# Patient Record
Sex: Female | Born: 1965 | Race: White | Hispanic: No | Smoking: Former smoker
Health system: Southern US, Community
[De-identification: ages and names within clinical notes are randomized; demographics above are authoritative.]

## PROBLEM LIST (undated history)

## (undated) DIAGNOSIS — E119 Type 2 diabetes mellitus without complications: Secondary | ICD-10-CM

## (undated) HISTORY — PX: OOPHORECTOMY: SHX86

---

## 2006-09-03 ENCOUNTER — Other Ambulatory Visit: Payer: Self-pay

## 2006-09-03 ENCOUNTER — Emergency Department: Payer: Self-pay | Admitting: Emergency Medicine

## 2007-02-20 ENCOUNTER — Ambulatory Visit: Payer: Self-pay | Admitting: Family Medicine

## 2007-08-23 ENCOUNTER — Ambulatory Visit: Payer: Self-pay | Admitting: Nurse Practitioner

## 2007-10-11 ENCOUNTER — Emergency Department: Payer: Self-pay | Admitting: Emergency Medicine

## 2010-04-16 ENCOUNTER — Ambulatory Visit: Payer: Self-pay | Admitting: Obstetrics and Gynecology

## 2010-04-20 ENCOUNTER — Ambulatory Visit: Payer: Self-pay | Admitting: Obstetrics and Gynecology

## 2010-05-04 ENCOUNTER — Ambulatory Visit: Payer: Self-pay | Admitting: Obstetrics and Gynecology

## 2010-05-07 ENCOUNTER — Ambulatory Visit: Payer: Self-pay | Admitting: Obstetrics and Gynecology

## 2010-05-09 ENCOUNTER — Ambulatory Visit: Payer: Self-pay | Admitting: Obstetrics and Gynecology

## 2010-05-11 LAB — PATHOLOGY REPORT

## 2010-05-20 ENCOUNTER — Ambulatory Visit: Payer: Self-pay | Admitting: Family Medicine

## 2011-06-30 ENCOUNTER — Ambulatory Visit: Payer: Self-pay | Admitting: Obstetrics and Gynecology

## 2013-01-02 ENCOUNTER — Emergency Department: Payer: Self-pay | Admitting: Emergency Medicine

## 2013-01-02 LAB — COMPREHENSIVE METABOLIC PANEL WITH GFR
Albumin: 3.7 g/dL
Alkaline Phosphatase: 77 U/L
Anion Gap: 6 — ABNORMAL LOW
BUN: 10 mg/dL
Bilirubin,Total: 0.4 mg/dL
Calcium, Total: 8.5 mg/dL
Chloride: 105 mmol/L
Co2: 27 mmol/L
Creatinine: 0.82 mg/dL
EGFR (African American): >60
EGFR (Non-African Amer.): >60
Glucose: 114 mg/dL — ABNORMAL HIGH
Osmolality: 276
Potassium: 3.7 mmol/L
SGOT(AST): 19 U/L
SGPT (ALT): 18 U/L
Sodium: 138 mmol/L
Total Protein: 7.5 g/dL

## 2013-01-02 LAB — CBC
HCT: 39.4 %
HGB: 13.2 g/dL
MCH: 26.4 pg
MCHC: 33.6 g/dL
MCV: 79 fL — ABNORMAL LOW
Platelet: 331 x10 3/mm 3
RBC: 5.01 X10 6/mm 3
RDW: 14.5 %
WBC: 8.7 x10 3/mm 3

## 2013-01-07 LAB — CULTURE, BLOOD (SINGLE)

## 2014-06-19 ENCOUNTER — Ambulatory Visit: Payer: Self-pay | Admitting: Internal Medicine

## 2014-06-24 ENCOUNTER — Ambulatory Visit: Payer: Self-pay | Admitting: Internal Medicine

## 2014-08-07 DIAGNOSIS — Z6841 Body Mass Index (BMI) 40.0 and over, adult: Secondary | ICD-10-CM | POA: Insufficient documentation

## 2015-04-27 ENCOUNTER — Other Ambulatory Visit: Payer: Self-pay | Admitting: Internal Medicine

## 2015-04-27 DIAGNOSIS — Z1231 Encounter for screening mammogram for malignant neoplasm of breast: Secondary | ICD-10-CM

## 2015-06-23 ENCOUNTER — Ambulatory Visit
Admission: RE | Admit: 2015-06-23 | Discharge: 2015-06-23 | Disposition: A | Discharge status: Home / Self Care | Payer: BC Managed Care – PPO | Source: Ambulatory Visit | Attending: Internal Medicine | Admitting: Internal Medicine

## 2015-06-23 DIAGNOSIS — Z1231 Encounter for screening mammogram for malignant neoplasm of breast: Secondary | ICD-10-CM | POA: Diagnosis present

## 2015-09-10 ENCOUNTER — Ambulatory Visit
Admission: RE | Admit: 2015-09-10 | Discharge: 2015-09-10 | Disposition: A | Discharge status: Home / Self Care | Payer: BC Managed Care – PPO | Source: Ambulatory Visit | Attending: Internal Medicine | Admitting: Internal Medicine

## 2015-09-10 ENCOUNTER — Other Ambulatory Visit: Payer: Self-pay | Admitting: Internal Medicine

## 2015-09-10 DIAGNOSIS — Z6841 Body Mass Index (BMI) 40.0 and over, adult: Secondary | ICD-10-CM | POA: Diagnosis present

## 2015-09-10 DIAGNOSIS — R935 Abnormal findings on diagnostic imaging of other abdominal regions, including retroperitoneum: Secondary | ICD-10-CM | POA: Diagnosis not present

## 2015-09-10 DIAGNOSIS — R1032 Left lower quadrant pain: Secondary | ICD-10-CM | POA: Insufficient documentation

## 2015-09-10 HISTORY — DX: Type 2 diabetes mellitus without complications: E11.9

## 2015-09-10 LAB — POCT I-STAT CREATININE: Creatinine, Ser: 0.7 mg/dL (ref 0.44–1.00)

## 2015-09-10 MED ORDER — IOPAMIDOL (ISOVUE-300) INJECTION 61%
100.0000 mL | Freq: Once | INTRAVENOUS | Status: AC | PRN
  Administered 2015-09-10: 100 mL via INTRAVENOUS

## 2016-07-19 ENCOUNTER — Ambulatory Visit
Admission: RE | Admit: 2016-07-19 | Discharge: 2016-07-19 | Disposition: A | Discharge status: Home / Self Care | Payer: BC Managed Care – PPO | Source: Ambulatory Visit | Attending: Internal Medicine | Admitting: Internal Medicine

## 2016-07-19 ENCOUNTER — Other Ambulatory Visit: Payer: Self-pay | Admitting: Internal Medicine

## 2016-07-19 DIAGNOSIS — R52 Pain, unspecified: Secondary | ICD-10-CM

## 2016-07-19 DIAGNOSIS — Z1231 Encounter for screening mammogram for malignant neoplasm of breast: Secondary | ICD-10-CM

## 2016-07-19 DIAGNOSIS — M25522 Pain in left elbow: Secondary | ICD-10-CM | POA: Diagnosis present

## 2016-07-26 DIAGNOSIS — L0293 Carbuncle, unspecified: Secondary | ICD-10-CM | POA: Insufficient documentation

## 2016-07-26 DIAGNOSIS — A4902 Methicillin resistant Staphylococcus aureus infection, unspecified site: Secondary | ICD-10-CM | POA: Insufficient documentation

## 2016-08-08 ENCOUNTER — Telehealth: Payer: Self-pay

## 2016-08-08 ENCOUNTER — Ambulatory Visit
Admission: RE | Admit: 2016-08-08 | Discharge: 2016-08-08 | Disposition: A | Discharge status: Home / Self Care | Payer: BC Managed Care – PPO | Source: Ambulatory Visit | Attending: Internal Medicine | Admitting: Internal Medicine

## 2016-08-08 ENCOUNTER — Ambulatory Visit (INDEPENDENT_AMBULATORY_CARE_PROVIDER_SITE_OTHER): Payer: BC Managed Care – PPO | Admitting: Gastroenterology

## 2016-08-08 ENCOUNTER — Other Ambulatory Visit: Payer: Self-pay | Admitting: Internal Medicine

## 2016-08-08 ENCOUNTER — Encounter: Payer: Self-pay | Admitting: Gastroenterology

## 2016-08-08 ENCOUNTER — Other Ambulatory Visit: Payer: Self-pay

## 2016-08-08 VITALS — BP 124/81 | HR 84 | Temp 98.2°F | Ht 65.0 in | Wt 265.0 lb

## 2016-08-08 DIAGNOSIS — E6609 Other obesity due to excess calories: Secondary | ICD-10-CM | POA: Diagnosis not present

## 2016-08-08 DIAGNOSIS — Z1231 Encounter for screening mammogram for malignant neoplasm of breast: Secondary | ICD-10-CM | POA: Insufficient documentation

## 2016-08-08 DIAGNOSIS — IMO0001 Reserved for inherently not codable concepts without codable children: Secondary | ICD-10-CM

## 2016-08-08 DIAGNOSIS — K5792 Diverticulitis of intestine, part unspecified, without perforation or abscess without bleeding: Secondary | ICD-10-CM

## 2016-08-08 DIAGNOSIS — Z6841 Body Mass Index (BMI) 40.0 and over, adult: Secondary | ICD-10-CM | POA: Diagnosis not present

## 2016-08-08 DIAGNOSIS — K5732 Diverticulitis of large intestine without perforation or abscess without bleeding: Secondary | ICD-10-CM | POA: Diagnosis not present

## 2016-08-08 DIAGNOSIS — Z87898 Personal history of other specified conditions: Secondary | ICD-10-CM

## 2016-08-08 DIAGNOSIS — R0683 Snoring: Secondary | ICD-10-CM

## 2016-08-08 NOTE — Telephone Encounter (Signed)
Gastroenterology Pre-Procedure Review  Request Date: 08/26/16 Requesting Physician: Dr. Tobi Bastos  PATIENT REVIEW QUESTIONS: The patient responded to the following health history questions as indicated:    1. Are you having any GI issues? yes (diverticulitis) 2. Do you have a personal history of Polyps? no 3. Do you have a family history of Colon Cancer or Polyps? no 4. 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 Joint replacements in the past 12 months?no 6. Major health problems in the past 3 months?no 7. Any artificial heart valves, MVP, or defibrillator?no    MEDICATIONS & ALLERGIES:    Patient reports the following regarding taking any anticoagulation/antiplatelet therapy:   Plavix, Coumadin, Eliquis, Xarelto, Lovenox, Pradaxa, Brilinta, or Effient? no Aspirin? no  Patient confirms/reports the following medications:  Current Outpatient Prescriptions  Medication Sig Dispense Refill  . diclofenac sodium (VOLTAREN) 1 % GEL     . metFORMIN (GLUCOPHAGE) 500 MG tablet Take by mouth.    . phentermine 37.5 MG capsule Take by mouth.    . predniSONE (DELTASONE) 10 MG tablet 6 tabs x 1 day, 5 tabs x 1 day, 4 tabs x 1 day, etc...     No current facility-administered medications for this visit.     Patient confirms/reports the following allergies:  No Known Allergies  No orders of the defined types were placed in this encounter.   AUTHORIZATION INFORMATION Primary Insurance: 1D#: Group #:  Secondary Insurance: 1D#: Group #:  SCHEDULE INFORMATION: Date: 08/26/16 Time: Location: [facility]

## 2016-08-08 NOTE — Progress Notes (Signed)
Gastroenterology Consultation  Referring Provider:     Lyndon Code, MD Primary Care Physician:  Carol Code, MD Primary Gastroenterologist:  Carol [name]  Reason for Consultation:     Diverticulitis.         HPI:   Carol [name] is a 51 y.o. y/o female referred for consultation & management  by Carol. Welton [name], Carol Harper, MD.    She says that she was seen by Carol [name] , she said she had diverticulitis 6 months back , did have a CT scan . CT scan in 09/2015 showed diverticulitis of sigmoid colon. She did not have a colonoscopy after.   Presently says on and off constipation , sometimes diarrhea. Denies any blood in her stool.No change in shape of stool. No weight loss.She does snore, does wake up in the night , does not feel refreshed in the morning , snores a lot . Never been tested for sleep apnea.   Mother had lung cancer-smoker Father - liver and lung cancer- smoker Brother- lymphoma    Body mass index is 44.1 kg/m.    Past Medical History:  Diagnosis Date  . Diabetes mellitus without complication [facility])     Past Surgical History:  Procedure Laterality Date  . OOPHORECTOMY     1 removed    Prior to Admission medications   Medication Sig Start Date End Date Taking? Authorizing Provider  metFORMIN (GLUCOPHAGE) 500 MG tablet Take by mouth. 03/25/16 03/25/17 Yes Historical Provider, MD  phentermine 37.5 MG capsule Take by mouth. 05/05/16  Yes Historical Provider, MD  predniSONE (DELTASONE) 10 MG tablet 6 tabs x 1 day, 5 tabs x 1 day, 4 tabs x 1 day, etc... 01/20/16  Yes Historical Provider, MD  diclofenac sodium (VOLTAREN) 1 % GEL  07/19/16   Historical Provider, MD    Family History  Problem Relation Age of Onset  . Breast cancer Maternal Aunt 23     Social History  Substance Use Topics  . Smoking status: Not on file  . Smokeless tobacco: Not on file  . Alcohol use Not on file    Allergies as of 08/08/2016  . (Not on File)    Review of Systems:    All  systems reviewed and negative except where noted in HPI.   Physical Exam:  LMP 08/05/2016  Patient's last menstrual period was 08/05/2016. Psych:  Alert and cooperative. Normal [name] and affect. General:   Alert,  Well-developed, well-nourished, pleasant and cooperative in NAD Head:  Normocephalic and atraumatic. Eyes:  Sclera clear, no icterus.   Conjunctiva pink. Ears:  Normal auditory acuity. Nose:  No deformity, discharge, or lesions. Mouth:  No deformity or lesions,oropharynx pink & moist. Neck:  Supple; no masses or thyromegaly. Lungs:  Respirations even and unlabored.  Clear throughout to auscultation.   No wheezes, crackles, or rhonchi. No acute distress. Heart:  Regular rate and rhythm; no murmurs, clicks, rubs, or gallops. Abdomen:  Normal bowel sounds.  No bruits.  Soft, non-tender and non-distended without masses, hepatosplenomegaly or hernias noted.  No guarding or rebound tenderness.    Msk:  Symmetrical without gross deformities. Good, equal movement & strength bilaterally. Pulses:  Normal pulses noted. Neurologic:  Alert and oriented x3;  grossly normal neurologically. Psych:  Alert and cooperative. Normal [name] and affect.  Imaging Studies: Dg Elbow Complete Left (3+view)  Result Date: 07/19/2016 CLINICAL DATA:  Pain and swelling for several weeks, no known injury, initial encounter EXAM: LEFT ELBOW -  COMPLETE 3+ VIEW COMPARISON:  None. FINDINGS: There is no evidence of fracture, dislocation, or joint effusion. There is no evidence of arthropathy or other focal bone abnormality. Soft tissues are unremarkable. IMPRESSION: No acute abnormality noted. Electronically Signed   By: Alcide Clever M.D.   On: 07/19/2016 14:46   Mm Screening Breast Tomo Bilateral  Result Date: 08/08/2016 CLINICAL DATA:  Screening. EXAM: 2D DIGITAL SCREENING BILATERAL MAMMOGRAM WITH CAD AND ADJUNCT TOMO COMPARISON:  Previous exam(s). ACR Breast Density Category b: There are scattered areas of  fibroglandular density. FINDINGS: There are no findings suspicious for malignancy. Images were processed with CAD. IMPRESSION: No mammographic evidence of malignancy. A result letter of this screening mammogram will be mailed directly to the patient. RECOMMENDATION: Screening mammogram in one year. ([name]:[id number]) BI-RADS CATEGORY  1: Negative. Electronically Signed   By: Frederico Hamman M.D.   On: 08/08/2016 11:31    Assessment and Plan:   Carol [name] is a 51 y.o. y/o female has been referred for diverticulitis which she had in 09/2015. Didn't have a diagnostic colonoscopy after. I will proceed with the same. Her history also suggests she may have sleep apnea and I will send her for testing . I advised her to loose weight as she is morbidly obese. She is at a higher risk for anesthesia related issues during the procedure due to BMI  I have discussed alternative options, risks & benefits,  which include, but are not limited to, bleeding, infection, perforation,respiratory complication & drug reaction.  The patient agrees with this plan & written consent will be obtained.    Follow up as needed  Carol Wyline Mood MD

## 2016-08-15 ENCOUNTER — Ambulatory Visit: Payer: BC Managed Care – PPO | Admitting: Gastroenterology

## 2016-08-18 ENCOUNTER — Ambulatory Visit: Payer: BC Managed Care – PPO

## 2016-08-19 ENCOUNTER — Telehealth: Payer: Self-pay | Admitting: Gastroenterology

## 2016-08-19 NOTE — Telephone Encounter (Signed)
08/19/16 Spoke with Montel Clock at [facility] and No prior auth required for colonoscopy [address] / [phone]

## 2016-08-26 ENCOUNTER — Ambulatory Visit
Admission: RE | Admit: 2016-08-26 | Discharge: 2016-08-26 | Disposition: A | Discharge status: Home / Self Care | Payer: BC Managed Care – PPO | Source: Ambulatory Visit | Attending: Gastroenterology | Admitting: Gastroenterology

## 2016-08-26 ENCOUNTER — Encounter
Admission: RE | Disposition: A | Discharge status: Home / Self Care | Payer: Self-pay | Source: Ambulatory Visit | Attending: Gastroenterology

## 2016-08-26 ENCOUNTER — Ambulatory Visit: Payer: BC Managed Care – PPO | Admitting: Anesthesiology

## 2016-08-26 ENCOUNTER — Encounter: Payer: Self-pay | Admitting: Anesthesiology

## 2016-08-26 DIAGNOSIS — Z7984 Long term (current) use of oral hypoglycemic drugs: Secondary | ICD-10-CM | POA: Insufficient documentation

## 2016-08-26 DIAGNOSIS — E669 Obesity, unspecified: Secondary | ICD-10-CM | POA: Diagnosis not present

## 2016-08-26 DIAGNOSIS — Z87891 Personal history of nicotine dependence: Secondary | ICD-10-CM | POA: Insufficient documentation

## 2016-08-26 DIAGNOSIS — K5730 Diverticulosis of large intestine without perforation or abscess without bleeding: Secondary | ICD-10-CM | POA: Insufficient documentation

## 2016-08-26 DIAGNOSIS — K5792 Diverticulitis of intestine, part unspecified, without perforation or abscess without bleeding: Secondary | ICD-10-CM | POA: Insufficient documentation

## 2016-08-26 DIAGNOSIS — K648 Other hemorrhoids: Secondary | ICD-10-CM | POA: Diagnosis not present

## 2016-08-26 DIAGNOSIS — K640 First degree hemorrhoids: Secondary | ICD-10-CM | POA: Diagnosis not present

## 2016-08-26 DIAGNOSIS — K5732 Diverticulitis of large intestine without perforation or abscess without bleeding: Secondary | ICD-10-CM | POA: Diagnosis not present

## 2016-08-26 DIAGNOSIS — E119 Type 2 diabetes mellitus without complications: Secondary | ICD-10-CM | POA: Diagnosis not present

## 2016-08-26 DIAGNOSIS — Z7952 Long term (current) use of systemic steroids: Secondary | ICD-10-CM | POA: Insufficient documentation

## 2016-08-26 DIAGNOSIS — Z6841 Body Mass Index (BMI) 40.0 and over, adult: Secondary | ICD-10-CM | POA: Insufficient documentation

## 2016-08-26 HISTORY — PX: COLONOSCOPY WITH PROPOFOL: SHX5780

## 2016-08-26 LAB — GLUCOSE, CAPILLARY: GLUCOSE-CAPILLARY: 141 mg/dL — AB (ref 65–99)

## 2016-08-26 SURGERY — COLONOSCOPY WITH PROPOFOL
Anesthesia: General

## 2016-08-26 MED ORDER — PROPOFOL 10 MG/ML IV BOLUS
INTRAVENOUS | Status: DC | PRN
  Administered 2016-08-26: 100 mg via INTRAVENOUS

## 2016-08-26 MED ORDER — SODIUM CHLORIDE 0.9 % IV SOLN
INTRAVENOUS | Status: DC
  Administered 2016-08-26: 11:00:00 via INTRAVENOUS
  Administered 2016-08-26: 1000 mL via INTRAVENOUS

## 2016-08-26 MED ORDER — PROPOFOL 500 MG/50ML IV EMUL
INTRAVENOUS | Status: DC | PRN
  Administered 2016-08-26: 175 ug/kg/min via INTRAVENOUS

## 2016-08-26 NOTE — Transfer of Care (Signed)
Immediate Anesthesia Transfer of Care Note  Patient: Carol [name]  Procedure(s) Performed: Procedure(s): COLONOSCOPY WITH PROPOFOL (N/A)  Patient Location: PACU  Anesthesia Type:General  Level of Consciousness: sedated  Airway & Oxygen Therapy: Patient Spontanous Breathing and Patient connected to nasal cannula oxygen  Post-op Assessment: Report given to RN and Post -op Vital signs reviewed and stable  Post vital signs: Reviewed and stable  Last Vitals:  Vitals:   08/26/16 1030  BP: 110/68  Pulse: 92  Resp: 16  Temp: (!) 35.7 C    Last Pain:  Vitals:   08/26/16 1030  TempSrc: Tympanic         Complications: No apparent anesthesia complications

## 2016-08-26 NOTE — Anesthesia Procedure Notes (Addendum)
Date/Time: 08/26/2016 11:34 AM Performed by: Junious Silk Pre-anesthesia Checklist: Patient identified, Emergency Drugs available, Suction available, Patient being monitored and Timeout performed Oxygen Delivery Method: Nasal cannula

## 2016-08-26 NOTE — Anesthesia Postprocedure Evaluation (Signed)
Anesthesia Post Note  Patient: Carol [name]  Procedure(s) Performed: Procedure(s) (LRB): COLONOSCOPY WITH PROPOFOL (N/A)  Patient location during evaluation: PACU Anesthesia Type: General Level of consciousness: awake and alert and oriented Pain management: pain level controlled Vital Signs Assessment: post-procedure vital signs reviewed and stable Respiratory status: spontaneous breathing Cardiovascular status: blood pressure returned to baseline Anesthetic complications: no     Last Vitals:  Vitals:   08/26/16 1210 08/26/16 1220  BP: (!) 134/92 139/89  Pulse:    Resp:    Temp:      Last Pain:  Vitals:   08/26/16 1150  TempSrc: Tympanic                 Sherril Shipman

## 2016-08-26 NOTE — Anesthesia Post-op Follow-up Note (Cosign Needed)
Anesthesia QCDR form completed.        

## 2016-08-26 NOTE — Op Note (Signed)
[facility] Gastroenterology Patient Name: Carol [name] Procedure Date: 08/26/2016 11:26 AM MRN: [id number] Account #: [id number] Date of Birth: 20-May-1965 Admit Type: Outpatient Age: 51 Room: [facility] ENDO ROOM 4 Gender: Female Note Status: Finalized Procedure:            Colonoscopy Indications:          Diverticulitis Providers:            Wyline Mood MD, MD Referring MD:         Lyndon Code, MD (Referring MD) Medicines:            Monitored Anesthesia Care Complications:        No immediate complications. Procedure:            Pre-Anesthesia Assessment:                       - Prior to the procedure, a History and Physical was                        performed, and patient medications, allergies and                        sensitivities were reviewed. The patient's tolerance of                        previous anesthesia was reviewed.                       - The risks and benefits of the procedure and the                        sedation options and risks were discussed with the                        patient. All questions were answered and informed                        consent was obtained.                       - ASA Grade Assessment: III - A patient with severe                        systemic disease.                       After obtaining informed consent, the colonoscope was                        passed under direct vision. Throughout the procedure,                        the patient's blood pressure, pulse, and oxygen                        saturations were monitored continuously. The                        Colonoscope was introduced through the and advanced to                        the  the cecum, identified by the appendiceal orifice,                        IC valve and transillumination. The colonoscopy was                        performed with ease. The patient tolerated the                        procedure well. The quality of the bowel preparation                         was good. Findings:      The perianal and digital rectal examinations were normal.      Non-bleeding internal hemorrhoids were found during retroflexion. The       hemorrhoids were medium-sized and Grade I (internal hemorrhoids that do       not prolapse).      Multiple medium-mouthed diverticula were found in the entire colon.      The exam was otherwise without abnormality on direct and retroflexion       views. Impression:           - Non-bleeding internal hemorrhoids.                       - Diverticulosis in the entire examined colon.                       - The examination was otherwise normal on direct and                        retroflexion views.                       - No specimens collected. Recommendation:       - Discharge patient to home (with escort).                       - Resume previous diet.                       - Continue present medications.                       - Repeat colonoscopy in 10 years for screening purposes. Procedure Code(s):    --- Professional ---                       [phone], Colonoscopy, flexible; diagnostic, including                        collection of specimen(s) by brushing or washing, when                        performed (separate procedure) Diagnosis Code(s):    --- Professional ---                       K64.0, First degree hemorrhoids                       K57.32, Diverticulitis of large intestine without  perforation or abscess without bleeding                       K57.30, Diverticulosis of large intestine without                        perforation or abscess without bleeding CPT copyright 2016 American Medical Association. All rights reserved. The codes documented in this report are preliminary and upon coder review may  be revised to meet current compliance requirements. Wyline Mood, MD Wyline Mood MD, MD 08/26/2016 11:47:44 AM This report has been signed electronically. Number of Addenda:  0 Note Initiated On: 08/26/2016 11:26 AM Scope Withdrawal Time: 0 hours 10 minutes 51 seconds  Total Procedure Duration: 0 hours 13 minutes 40 seconds       [facility]

## 2016-08-26 NOTE — Anesthesia Preprocedure Evaluation (Addendum)
Anesthesia Evaluation  Patient identified by MRN, date of birth, ID band Patient awake    Reviewed: Allergy & Precautions, NPO status , Patient's Chart, lab work & pertinent test results  Airway Mallampati: III       Dental  (+) Caps, Chipped   Pulmonary former smoker,    Pulmonary exam normal        Cardiovascular negative cardio ROS Normal cardiovascular exam     Neuro/Psych negative neurological ROS  negative psych ROS   GI/Hepatic negative GI ROS, Neg liver ROS,   Endo/Other  diabetes, Well Controlled, Type 2, Oral Hypoglycemic Agents  Renal/GU negative Renal ROS  negative genitourinary   Musculoskeletal negative musculoskeletal ROS (+)   Abdominal Normal abdominal exam  (+) + obese,   Peds negative pediatric ROS (+)  Hematology negative hematology ROS (+)   Anesthesia Other Findings Past Medical History: No date: Diabetes mellitus without complication (HCC)  Reproductive/Obstetrics                            Anesthesia Physical Anesthesia Plan  ASA: III  Anesthesia Plan: General   Post-op Pain Management:    Induction: Intravenous  Airway Management Planned: Nasal Cannula  Additional Equipment:   Intra-op Plan:   Post-operative Plan:   Informed Consent: I have reviewed the patients History and Physical, chart, labs and discussed the procedure including the risks, benefits and alternatives for the proposed anesthesia with the patient or authorized representative who has indicated his/her understanding and acceptance.   Dental advisory given  Plan Discussed with: CRNA and Surgeon  Anesthesia Plan Comments:         Anesthesia Quick Evaluation

## 2016-08-26 NOTE — H&P (Signed)
  Wyline Mood MD [address], [address] [place], [place] [address] Phone: [phone] Fax : [phone]  Primary Care Physician:  Lyndon Code, MD Primary Gastroenterologist:  Dr. Wyline Mood   Pre-Procedure History & Physical: HPI:  Carol [name] is a 51 y.o. female is here for an colonoscopy.   Past Medical History:  Diagnosis Date  . Diabetes mellitus without complication [facility])     Past Surgical History:  Procedure Laterality Date  . OOPHORECTOMY     1 removed    Prior to Admission medications   Medication Sig Start Date End Date Taking? Authorizing Provider  diclofenac sodium (VOLTAREN) 1 % GEL  07/19/16  Yes Historical Provider, MD  metFORMIN (GLUCOPHAGE) 500 MG tablet Take by mouth. 03/25/16 03/25/17 Yes Historical Provider, MD  phentermine 37.5 MG capsule Take by mouth. 05/05/16  Yes Historical Provider, MD  predniSONE (DELTASONE) 10 MG tablet 6 tabs x 1 day, 5 tabs x 1 day, 4 tabs x 1 day, etc... 01/20/16  Yes Historical Provider, MD    Allergies as of 08/08/2016  . (No Known Allergies)    Family History  Problem Relation Age of Onset  . Breast cancer Maternal Aunt 40  . Lung cancer Mother   . Lung cancer Father   . Pancreatic cancer Father   . Lymphoma Brother   . Cancer Maternal Grandmother     Social History   Social History  . Marital status: Divorced    Spouse name: N/A  . Number of children: N/A  . Years of education: N/A   Occupational History  . Not on file.   Social History Main Topics  . Smoking status: Former Smoker    Types: Cigarettes    Quit date: 08/09/1999  . Smokeless tobacco: Never Used  . Alcohol use No  . Drug use: No  . Sexual activity: Yes    Partners: Male    Birth control/ protection: None   Other Topics Concern  . Not on file   Social History Narrative  . No narrative on file    Review of Systems: See HPI, otherwise negative ROS  Physical Exam: BP 110/68   Pulse 92   Temp (!) 96.3 F (35.7 C)  (Tympanic)   Resp 16   Ht  (1.651 m)   Wt 268 lb (121.6 kg)   LMP 08/05/2016 Comment: Patient does not have fall. tubes.  Dr. Noralyn Pick states urine preg. not necessary.  SpO2 98%   BMI 44.60 kg/m  General:   Alert,  pleasant and cooperative in NAD Head:  Normocephalic and atraumatic. Neck:  Supple; no masses or thyromegaly. Lungs:  Clear throughout to auscultation.    Heart:  Regular rate and rhythm. Abdomen:  Soft, nontender and nondistended. Normal bowel sounds, without guarding, and without rebound.   Neurologic:  Alert and  oriented x4;  grossly normal neurologically.  Impression/Plan: Carol [name] is here for an colonoscopy to be performed for recent history of diverticulitis   Risks, benefits, limitations, and alternatives regarding  colonoscopy have been reviewed with the patient.  Questions have been answered.  All parties agreeable.   Wyline Mood, MD  08/26/2016, 10:39 AM

## 2016-08-29 ENCOUNTER — Encounter: Payer: Self-pay | Admitting: Gastroenterology

## 2017-08-25 ENCOUNTER — Other Ambulatory Visit: Payer: Self-pay | Admitting: Internal Medicine

## 2017-10-28 ENCOUNTER — Other Ambulatory Visit: Payer: Self-pay | Admitting: Internal Medicine

## 2017-11-30 ENCOUNTER — Other Ambulatory Visit: Payer: Self-pay | Admitting: Internal Medicine

## 2017-11-30 NOTE — Telephone Encounter (Signed)
Left message pt need to seen for fiurther refills last 5/18

## 2018-04-18 ENCOUNTER — Other Ambulatory Visit: Payer: Self-pay | Admitting: Internal Medicine

## 2018-04-18 DIAGNOSIS — Z1231 Encounter for screening mammogram for malignant neoplasm of breast: Secondary | ICD-10-CM

## 2018-05-08 ENCOUNTER — Ambulatory Visit
Admission: RE | Admit: 2018-05-08 | Discharge: 2018-05-08 | Disposition: A | Discharge status: Home / Self Care | Payer: BC Managed Care – PPO | Source: Ambulatory Visit | Attending: Internal Medicine | Admitting: Internal Medicine

## 2018-05-08 ENCOUNTER — Encounter (INDEPENDENT_AMBULATORY_CARE_PROVIDER_SITE_OTHER): Payer: Self-pay

## 2018-05-08 DIAGNOSIS — Z1231 Encounter for screening mammogram for malignant neoplasm of breast: Secondary | ICD-10-CM

## 2018-05-10 ENCOUNTER — Ambulatory Visit: Payer: BC Managed Care – PPO

## 2018-05-10 ENCOUNTER — Other Ambulatory Visit: Payer: Self-pay | Admitting: Internal Medicine

## 2018-05-10 DIAGNOSIS — R0789 Other chest pain: Secondary | ICD-10-CM

## 2019-02-21 IMAGING — CR DG ELBOW COMPLETE 3+V*L*
1 series · 4 of 4 positions shown · non-contrast
Comparison: None.

CLINICAL DATA: Pain and swelling for several weeks, no known
injury, initial encounter

EXAM:
LEFT ELBOW - COMPLETE 3+ VIEW

[Series 1: dg elbow complete left (3+view) · 0.14mm/px · 4 of 4 slices shown]
[im 1/4]
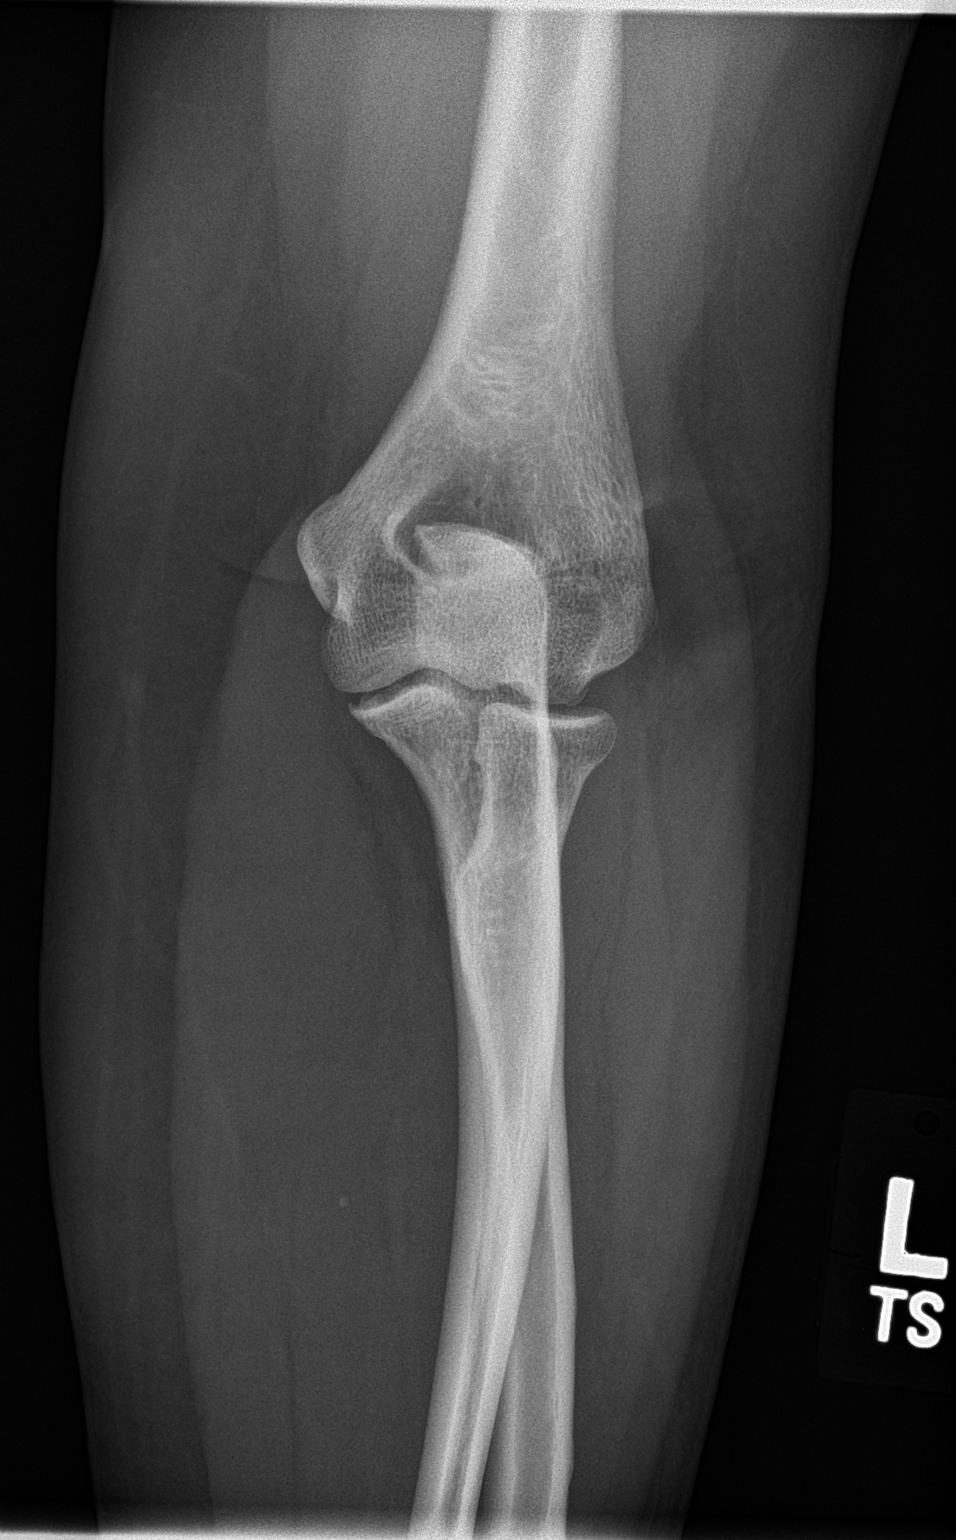
[im 2/4]
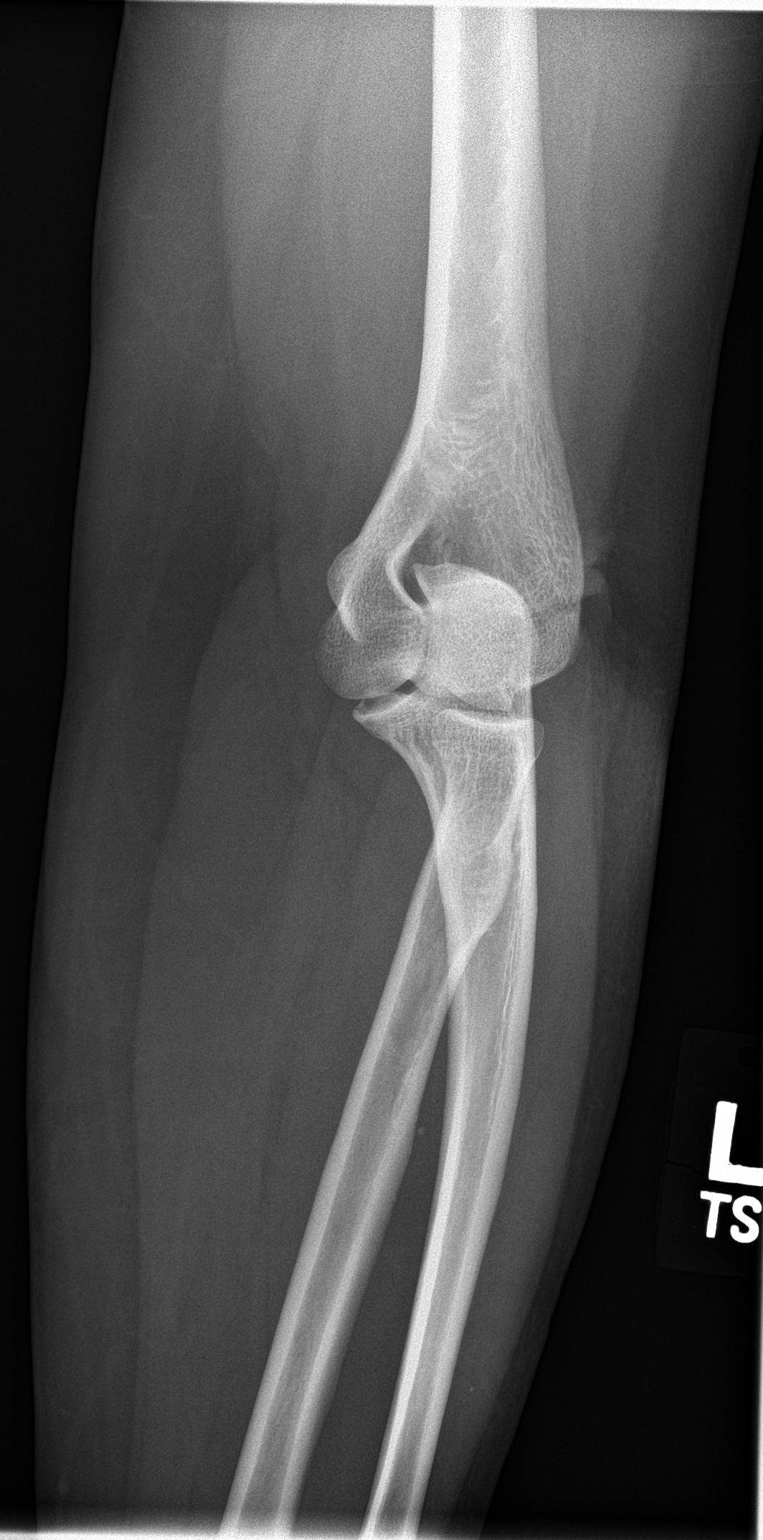
[im 3/4]
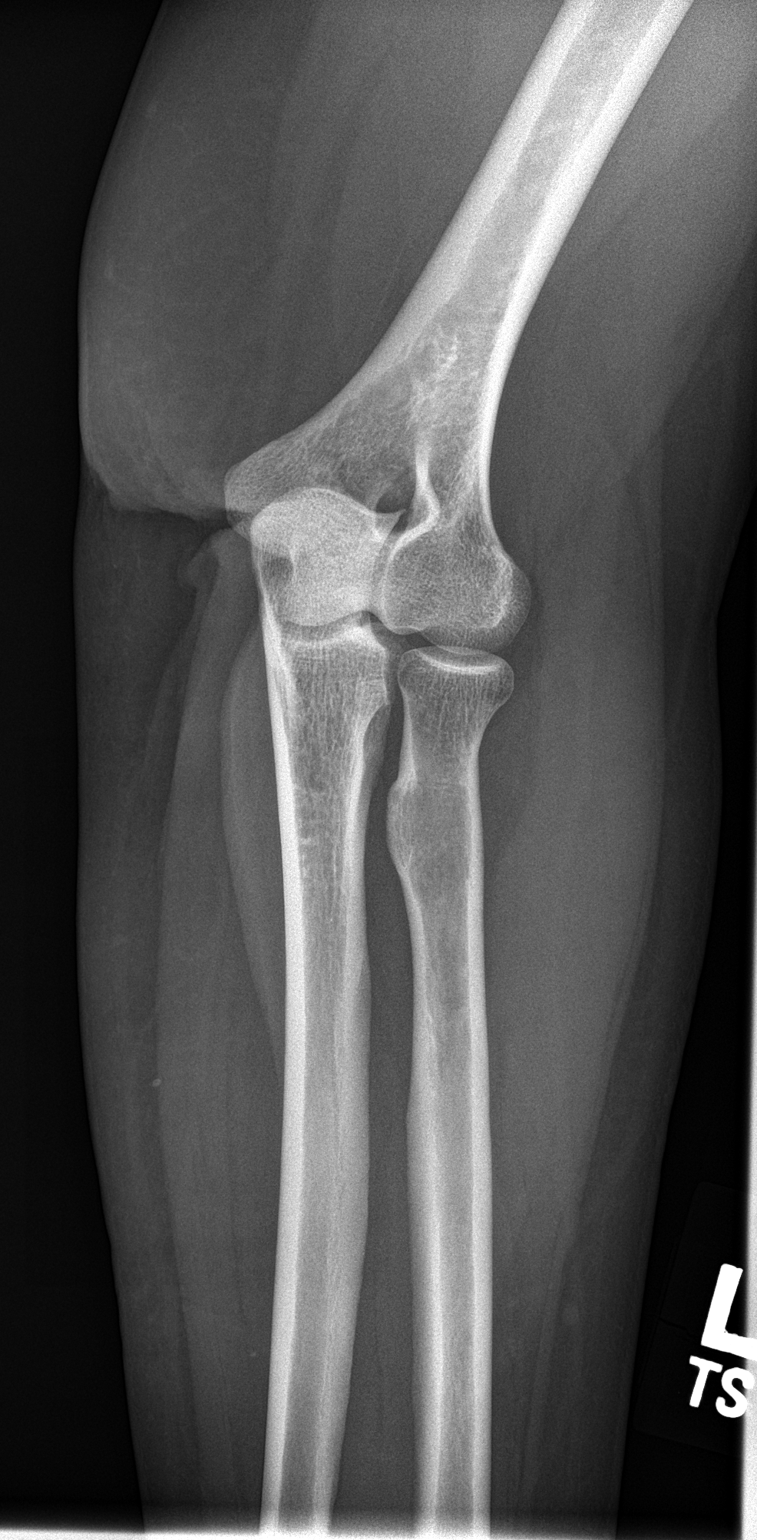
[im 4/4]
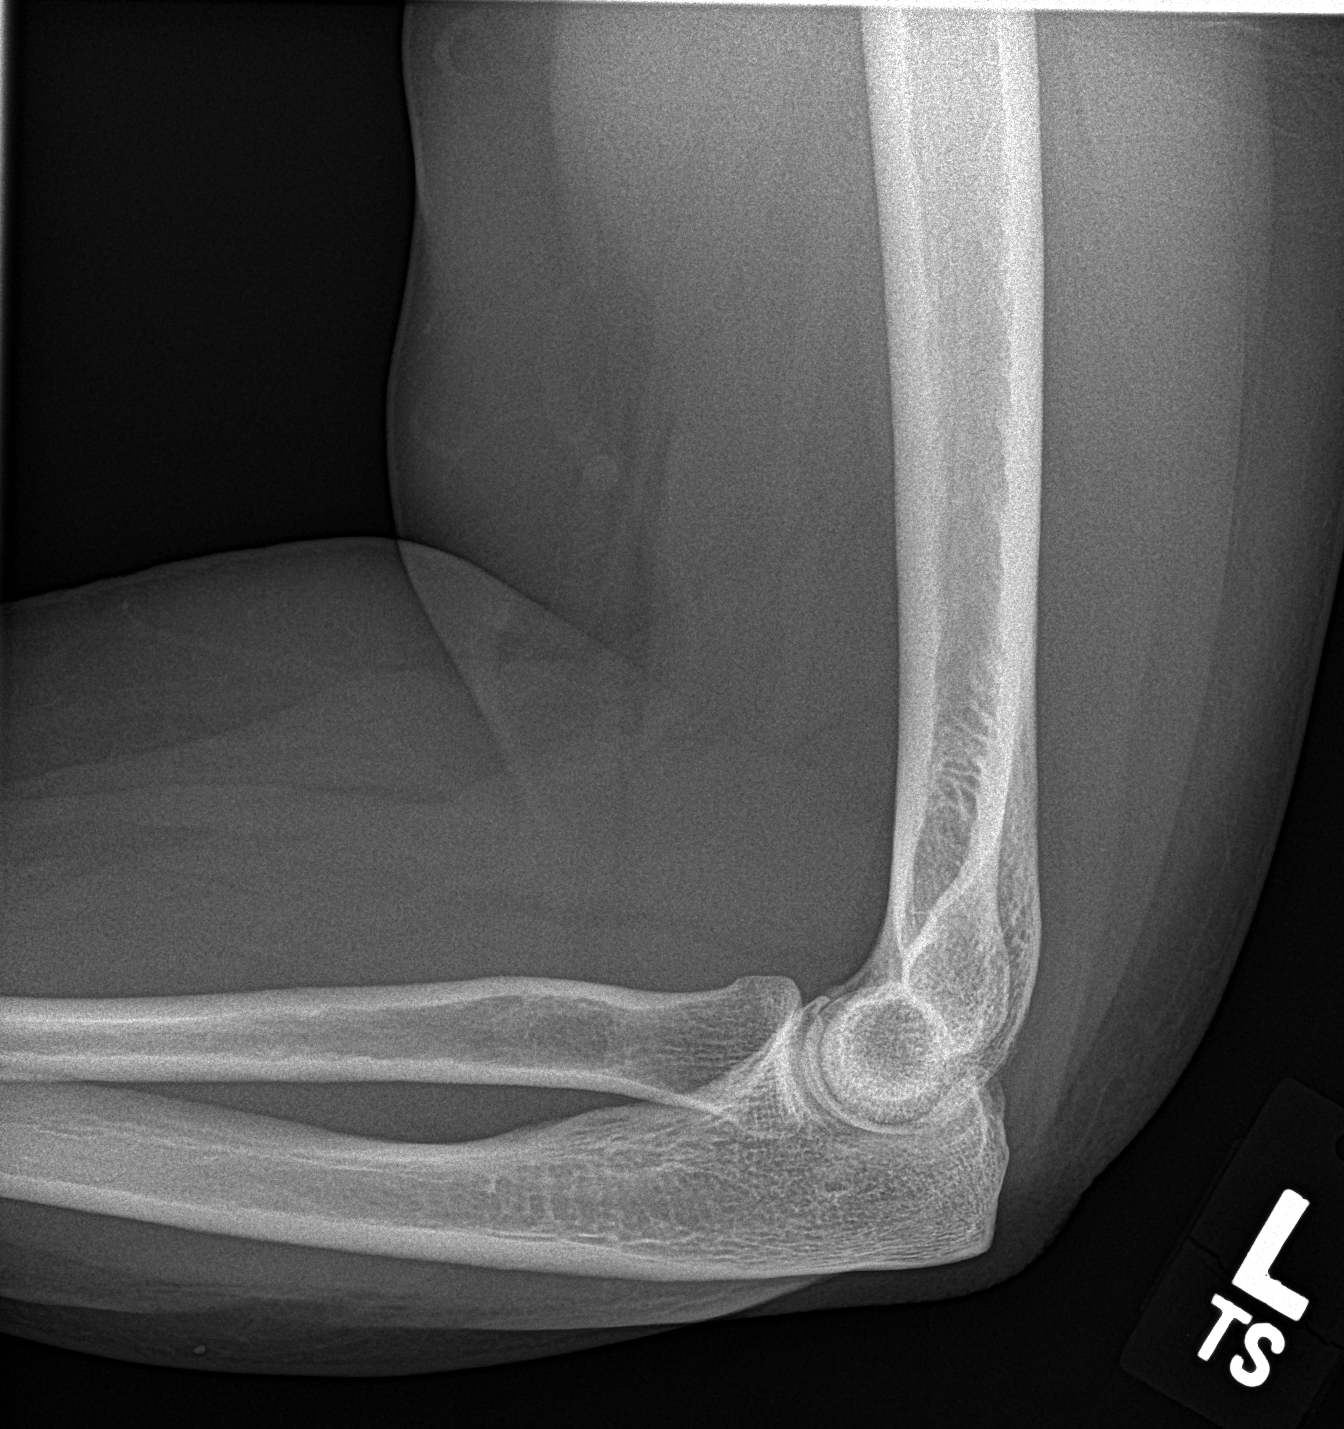

[4 of 4 positions shown; findings below may reference images not displayed]

FINDINGS: There is no evidence of fracture, dislocation, or joint effusion.
There is no evidence of arthropathy or other focal bone abnormality.
Soft tissues are unremarkable.
IMPRESSION: No acute abnormality noted.

## 2019-04-15 ENCOUNTER — Other Ambulatory Visit: Payer: Self-pay | Admitting: Internal Medicine

## 2019-04-15 DIAGNOSIS — Z1231 Encounter for screening mammogram for malignant neoplasm of breast: Secondary | ICD-10-CM

## 2019-05-06 ENCOUNTER — Other Ambulatory Visit: Payer: BC Managed Care – PPO

## 2019-05-09 ENCOUNTER — Other Ambulatory Visit: Payer: Self-pay

## 2019-05-09 ENCOUNTER — Ambulatory Visit
Admission: RE | Admit: 2019-05-09 | Discharge: 2019-05-09 | Disposition: A | Discharge status: Home / Self Care | Payer: BC Managed Care – PPO | Source: Ambulatory Visit | Attending: Internal Medicine | Admitting: Internal Medicine

## 2019-05-09 DIAGNOSIS — Z1231 Encounter for screening mammogram for malignant neoplasm of breast: Secondary | ICD-10-CM | POA: Insufficient documentation

## 2019-06-26 ENCOUNTER — Other Ambulatory Visit: Payer: Self-pay | Admitting: Internal Medicine

## 2019-06-26 DIAGNOSIS — M79602 Pain in left arm: Secondary | ICD-10-CM

## 2019-07-06 ENCOUNTER — Ambulatory Visit: Payer: BC Managed Care – PPO

## 2019-07-08 ENCOUNTER — Ambulatory Visit: Payer: BC Managed Care – PPO

## 2019-07-14 ENCOUNTER — Ambulatory Visit: Payer: BC Managed Care – PPO

## 2019-07-16 ENCOUNTER — Other Ambulatory Visit: Payer: Self-pay | Admitting: Sports Medicine

## 2019-07-16 DIAGNOSIS — M79602 Pain in left arm: Secondary | ICD-10-CM

## 2019-07-26 ENCOUNTER — Other Ambulatory Visit: Payer: Self-pay

## 2019-07-26 ENCOUNTER — Ambulatory Visit
Admission: RE | Admit: 2019-07-26 | Discharge: 2019-07-26 | Disposition: A | Discharge status: Home / Self Care | Payer: BC Managed Care – PPO | Source: Ambulatory Visit | Attending: Sports Medicine | Admitting: Sports Medicine

## 2019-07-26 DIAGNOSIS — M79602 Pain in left arm: Secondary | ICD-10-CM | POA: Diagnosis present

## 2020-04-09 ENCOUNTER — Other Ambulatory Visit: Payer: Self-pay | Admitting: Internal Medicine

## 2020-04-09 DIAGNOSIS — Z1231 Encounter for screening mammogram for malignant neoplasm of breast: Secondary | ICD-10-CM

## 2020-05-07 ENCOUNTER — Ambulatory Visit
Admission: RE | Admit: 2020-05-07 | Discharge: 2020-05-07 | Disposition: A | Discharge status: Home / Self Care | Payer: BC Managed Care – PPO | Source: Ambulatory Visit | Attending: Internal Medicine | Admitting: Internal Medicine

## 2020-05-07 ENCOUNTER — Other Ambulatory Visit: Payer: Self-pay

## 2020-05-07 DIAGNOSIS — Z1231 Encounter for screening mammogram for malignant neoplasm of breast: Secondary | ICD-10-CM | POA: Insufficient documentation

## 2020-06-02 ENCOUNTER — Emergency Department: Payer: BC Managed Care – PPO

## 2020-06-02 ENCOUNTER — Other Ambulatory Visit: Payer: Self-pay

## 2020-06-02 ENCOUNTER — Inpatient Hospital Stay
Admission: EM | Admit: 2020-06-02 | Discharge: 2020-06-04 | DRG: 177 | Disposition: A | Discharge status: Home / Self Care | Payer: BC Managed Care – PPO | Attending: Internal Medicine | Admitting: Internal Medicine

## 2020-06-02 DIAGNOSIS — Z794 Long term (current) use of insulin: Secondary | ICD-10-CM

## 2020-06-02 DIAGNOSIS — E1165 Type 2 diabetes mellitus with hyperglycemia: Secondary | ICD-10-CM | POA: Diagnosis present

## 2020-06-02 DIAGNOSIS — A0839 Other viral enteritis: Secondary | ICD-10-CM

## 2020-06-02 DIAGNOSIS — I10 Essential (primary) hypertension: Secondary | ICD-10-CM | POA: Diagnosis not present

## 2020-06-02 DIAGNOSIS — Z79899 Other long term (current) drug therapy: Secondary | ICD-10-CM

## 2020-06-02 DIAGNOSIS — Z87891 Personal history of nicotine dependence: Secondary | ICD-10-CM

## 2020-06-02 DIAGNOSIS — E876 Hypokalemia: Secondary | ICD-10-CM | POA: Diagnosis not present

## 2020-06-02 DIAGNOSIS — U071 COVID-19: Secondary | ICD-10-CM | POA: Diagnosis not present

## 2020-06-02 DIAGNOSIS — J1282 Pneumonia due to coronavirus disease 2019: Secondary | ICD-10-CM | POA: Diagnosis not present

## 2020-06-02 DIAGNOSIS — R0602 Shortness of breath: Secondary | ICD-10-CM | POA: Diagnosis not present

## 2020-06-02 DIAGNOSIS — Z7984 Long term (current) use of oral hypoglycemic drugs: Secondary | ICD-10-CM

## 2020-06-02 DIAGNOSIS — Z6841 Body Mass Index (BMI) 40.0 and over, adult: Secondary | ICD-10-CM

## 2020-06-02 DIAGNOSIS — J9601 Acute respiratory failure with hypoxia: Secondary | ICD-10-CM | POA: Diagnosis present

## 2020-06-02 LAB — BASIC METABOLIC PANEL
Anion gap: 10 (ref 5–15)
BUN: 18 mg/dL (ref 6–20)
CO2: 26 mmol/L (ref 22–32)
Calcium: 8.8 mg/dL — ABNORMAL LOW (ref 8.9–10.3)
Chloride: 102 mmol/L (ref 98–111)
Creatinine, Ser: 0.61 mg/dL (ref 0.44–1.00)
GFR, Estimated: >60 mL/min (ref >60)
Glucose, Bld: 118 mg/dL — ABNORMAL HIGH (ref 70–99)
Potassium: 3 mmol/L — ABNORMAL LOW (ref 3.5–5.1)
Sodium: 138 mmol/L (ref 135–145)

## 2020-06-02 LAB — CBG MONITORING, ED: Glucose-Capillary: 241 mg/dL — ABNORMAL HIGH (ref 70–99)

## 2020-06-02 LAB — CBC
HCT: 42 % (ref 36.0–46.0)
Hemoglobin: 13.9 g/dL (ref 12.0–15.0)
MCH: 26.3 pg (ref 26.0–34.0)
MCHC: 33.1 g/dL (ref 30.0–36.0)
MCV: 79.4 fL — ABNORMAL LOW (ref 80.0–100.0)
Platelets: 256 10*3/uL (ref 150–400)
RBC: 5.29 MIL/uL — ABNORMAL HIGH (ref 3.87–5.11)
RDW: 13 % (ref 11.5–15.5)
WBC: 7.4 10*3/uL (ref 4.0–10.5)
nRBC: 0 % (ref 0.0–0.2)

## 2020-06-02 LAB — FIBRIN DERIVATIVES D-DIMER (ARMC ONLY): Fibrin derivatives D-dimer (ARMC): 1068.33 ng/mL (FEU) — ABNORMAL HIGH (ref 0.00–499.00)

## 2020-06-02 LAB — PROCALCITONIN: Procalcitonin: <0.1 ng/mL

## 2020-06-02 LAB — POC SARS CORONAVIRUS 2 AG -  ED: SARS Coronavirus 2 Ag: POSITIVE — AB

## 2020-06-02 MED ORDER — ONDANSETRON HCL 4 MG PO TABS: 4.0000 mg | ORAL_TABLET | Freq: Four times a day (QID) | ORAL | Status: DC | PRN

## 2020-06-02 MED ORDER — ZINC SULFATE 220 (50 ZN) MG PO CAPS
220.0000 mg | ORAL_CAPSULE | Freq: Every day | ORAL | Status: DC
  Administered 2020-06-02 – 2020-06-04 (×3): 220 mg via ORAL
  Filled 2020-06-02 (×3): qty 1

## 2020-06-02 MED ORDER — DEXAMETHASONE SODIUM PHOSPHATE 10 MG/ML IJ SOLN
6.0000 mg | INTRAMUSCULAR | Status: DC
  Administered 2020-06-03 – 2020-06-04 (×2): 6 mg via INTRAVENOUS
  Filled 2020-06-02 (×2): qty 1

## 2020-06-02 MED ORDER — BISOPROLOL-HYDROCHLOROTHIAZIDE 2.5-6.25 MG PO TABS
1.0000 | ORAL_TABLET | Freq: Every day | ORAL | Status: DC
  Administered 2020-06-04: 10:00:00 1 via ORAL
  Filled 2020-06-02 (×3): qty 1

## 2020-06-02 MED ORDER — ACETAMINOPHEN 325 MG PO TABS: 325.0000 mg | ORAL_TABLET | Freq: Four times a day (QID) | ORAL | Status: DC | PRN

## 2020-06-02 MED ORDER — SODIUM CHLORIDE 0.9 % IV SOLN
100.0000 mg | Freq: Every day | INTRAVENOUS | Status: DC
  Administered 2020-06-03 – 2020-06-04 (×2): 100 mg via INTRAVENOUS
  Filled 2020-06-02 (×2): qty 20

## 2020-06-02 MED ORDER — ENOXAPARIN SODIUM 60 MG/0.6ML ~~LOC~~ SOLN
0.5000 mg/kg | SUBCUTANEOUS | Status: DC
  Administered 2020-06-02 – 2020-06-03 (×2): 60 mg via SUBCUTANEOUS
  Filled 2020-06-02 (×2): qty 0.6

## 2020-06-02 MED ORDER — ALBUTEROL SULFATE HFA 108 (90 BASE) MCG/ACT IN AERS
2.0000 | INHALATION_SPRAY | RESPIRATORY_TRACT | Status: DC | PRN
  Filled 2020-06-02: qty 6.7

## 2020-06-02 MED ORDER — POTASSIUM CHLORIDE 20 MEQ PO PACK
40.0000 meq | PACK | ORAL | Status: AC
  Administered 2020-06-02: 40 meq via ORAL
  Filled 2020-06-02: qty 2

## 2020-06-02 MED ORDER — ALBUTEROL SULFATE HFA 108 (90 BASE) MCG/ACT IN AERS
1.0000 | INHALATION_SPRAY | RESPIRATORY_TRACT | Status: DC
  Administered 2020-06-03 – 2020-06-04 (×5): 1 via RESPIRATORY_TRACT
  Filled 2020-06-02 (×2): qty 6.7

## 2020-06-02 MED ORDER — BUDESONIDE 0.5 MG/2ML IN SUSP
0.5000 mg | Freq: Once | RESPIRATORY_TRACT | Status: AC
  Administered 2020-06-02: 0.5 mg via RESPIRATORY_TRACT
  Filled 2020-06-02: qty 2

## 2020-06-02 MED ORDER — ASCORBIC ACID 500 MG PO TABS
500.0000 mg | ORAL_TABLET | Freq: Every day | ORAL | Status: DC
  Administered 2020-06-02 – 2020-06-04 (×3): 500 mg via ORAL
  Filled 2020-06-02 (×3): qty 1

## 2020-06-02 MED ORDER — METFORMIN HCL 500 MG PO TABS
1500.0000 mg | ORAL_TABLET | Freq: Every day | ORAL | Status: DC
  Administered 2020-06-03 – 2020-06-04 (×2): 1500 mg via ORAL
  Filled 2020-06-02 (×2): qty 3

## 2020-06-02 MED ORDER — INSULIN ASPART 100 UNIT/ML ~~LOC~~ SOLN
0.0000 [IU] | Freq: Every day | SUBCUTANEOUS | Status: DC
  Administered 2020-06-02 – 2020-06-03 (×2): 2 [IU] via SUBCUTANEOUS
  Filled 2020-06-02 (×2): qty 1

## 2020-06-02 MED ORDER — SODIUM CHLORIDE 0.9 % IV SOLN
200.0000 mg | Freq: Once | INTRAVENOUS | Status: AC
  Administered 2020-06-02: 200 mg via INTRAVENOUS
  Filled 2020-06-02: qty 200

## 2020-06-02 MED ORDER — ONDANSETRON HCL 4 MG/2ML IJ SOLN: 4.0000 mg | Freq: Four times a day (QID) | INTRAMUSCULAR | Status: DC | PRN

## 2020-06-02 MED ORDER — ENOXAPARIN SODIUM 40 MG/0.4ML ~~LOC~~ SOLN: 40.0000 mg | SUBCUTANEOUS | Status: DC

## 2020-06-02 MED ORDER — INSULIN ASPART 100 UNIT/ML ~~LOC~~ SOLN
0.0000 [IU] | Freq: Three times a day (TID) | SUBCUTANEOUS | Status: DC
  Administered 2020-06-03: 18:00:00 5 [IU] via SUBCUTANEOUS
  Filled 2020-06-02: qty 1

## 2020-06-02 MED ORDER — DEXAMETHASONE SODIUM PHOSPHATE 10 MG/ML IJ SOLN
8.0000 mg | Freq: Once | INTRAMUSCULAR | Status: AC
  Administered 2020-06-02: 8 mg via INTRAVENOUS
  Filled 2020-06-02: qty 1

## 2020-06-02 MED ORDER — GUAIFENESIN-DM 100-10 MG/5ML PO SYRP: 10.0000 mL | ORAL_SOLUTION | ORAL | Status: DC | PRN

## 2020-06-02 MED ORDER — LEVOFLOXACIN 750 MG PO TABS
750.0000 mg | ORAL_TABLET | Freq: Once | ORAL | Status: AC
  Administered 2020-06-02: 750 mg via ORAL
  Filled 2020-06-02: qty 1

## 2020-06-02 NOTE — H&P (Signed)
History and Physical   Carol [name] XLK:[id number] DOB: 04-04-1966 DOA: 06/02/2020  PCP: Barbette Reichmann, MD  Outpatient Specialists: none Patient coming from: Home  I have personally briefly reviewed patient's old medical records in [facility] EMR.  Chief Concern: Shortness of breath  HPI: Carol [name] is a 55 y.o. female with medical history significant for obesity, hypertension, non-insulin-dependent diabetes mellitus presented to the emergency department from [facility] clinic for chief concerns of hypoxia on room air.  She reports that she has been having shortness of breath, cough, decreased appetite for about 1 week. She endorses diarrhea for 5 days about 4x/day.   She endorses fever at home at tmax of 102. She states she took tylenol yesterday with minimal improvement of fever yesterday. She states she took tylenol again today and t max was 100 all day.   She reports she went to outpatient clinic for MAB infusion, however she didn't get it due to her spO2 desaturated to the mid 80s while at rest.  She reports she has never felt this way before.  She reports that with oxygen supplementation in the emergency department she has improved symptoms.  She took blood pressure medications and metformin before coming into the hospital.   Vaccinations: Patient is unvaccinated for COVID-19.  Social history: she lives by herself. 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 She denies tobacco use, etoh, recreational drug use.   ROS: Constitutional: no weight change, + fever ENT/Mouth: no sore throat, no rhinorrhea Eyes: no eye pain, no vision changes Cardiovascular: no chest pain, + dyspnea,  no edema, no palpitations Respiratory: + cough, no sputum, no wheezing Gastrointestinal: + nausea, no vomiting, no diarrhea, no constipation Genitourinary: no urinary incontinence, no dysuria, no hematuria Musculoskeletal: no arthralgias, + myalgias Skin: no skin  lesions, no pruritus, Neuro: + weakness, no loss of consciousness, no syncope Psych: no anxiety, no depression, + decrease appetite Heme/Lymph: no bruising, no bleeding  ED Course: Discussed with ED provider, patient required hospitalization for chief concerns of hypoxia on room air.  She was diagnosed with COVID-19 on 05/29/2020.  Assessment/Plan  Active Problems:   Shortness of breath   Shortness of breath secondary to COVID infection Acute hypoxemic respiratory failure secondary to COVID-19 infection - IV remdesivir per pharmacy, IV decadron initiated for daily - No suspicion for superimposed bacterial infection at this time as procalcitonin is negative  - Incentive spirometry and flutter valve for 10 reps every 2 hours while awake - Albuterol inhaler 2 puffs every 4 hours while awake - Daily labs: CMP, CBC, CRP, D-dimer - Supplemental oxygen to maintain SPO2 goal of greater than 88% - Airborne and contact precautions  Gastroenteritis secondary to COVID-19 infection-treat as above  Pneumonitis - nonspecific at this time - outpatient follow-up per PCP for autoimmune, rheumatoid related,   Hypertension-resumed home bisoprolol-hydrochlorothiazide 2.5-6.25 mg daily  NIDDM - metformin 1500 mg daily resumed - insulin ssi with HS coverage   Hypokalemia-multifactorial in setting of hydrochlorothiazide use and GI loss from diarrhea due to COVID-19 infection -Patient has been supplemented with p.o. 40 mEq potassium chloride -CMP in the a.m.  Chart reviewed.   DVT prophylaxis: Enoxaparin Code Status: full code  Diet: Heart healthy/carb modified Family Communication: No Disposition Plan: Pending clinical course Consults called: None at this time Admission status: Observation with telemetry  Past Medical History:  Diagnosis Date  . Diabetes mellitus without complication [facility])    Past Surgical History:  Procedure Laterality Date  .  COLONOSCOPY WITH PROPOFOL N/A 08/26/2016    Procedure: COLONOSCOPY WITH PROPOFOL;  Surgeon: Wyline Mood, MD;  Location: [facility] ENDOSCOPY;  Service: Endoscopy;  Laterality: N/A;  . OOPHORECTOMY     1 removed   Social History:  reports that she quit smoking about 20 years ago. Her smoking use included cigarettes. She has never used smokeless tobacco. She reports that she does not drink alcohol and does not use drugs.  No Known Allergies Family History  Problem Relation Age of Onset  . Breast cancer Maternal Aunt 40  . Lung cancer Mother   . Lung cancer Father   . Pancreatic cancer Father   . Lymphoma Brother   . Cancer Maternal Grandmother    Family history: Family history reviewed and not pertinent  Prior to Admission medications   Medication Sig Start Date End Date Taking? Authorizing Provider  acetaminophen (TYLENOL) 500 MG tablet Take 500 mg by mouth every 6 (six) hours as needed for mild pain.   Yes [provider]  albuterol (VENTOLIN HFA) 108 (90 Base) MCG/ACT inhaler Inhale 1 puff into the lungs every 6 (six) hours as needed. 05/29/20  Yes [provider]  bisoprolol-hydrochlorothiazide (ZIAC) 2.5-6.25 MG tablet TAKE 1 TABLET BY MOUTH EVERY MORNING FOR BLOOD PRESSURE 08/28/17  Yes Lyndon Code, MD  Dulaglutide (TRULICITY) 0.75 MG/0.5ML SOPN Inject 0.75 mg into the skin once a week.   Yes [provider]  metFORMIN (GLUCOPHAGE) 500 MG tablet Take by mouth. 03/25/16 03/25/17  [provider]   Physical Exam: Vitals:   06/02/20 1455 06/02/20 1500 06/02/20 1505 06/02/20 1510  BP:      Pulse: 88 87 88 91  Resp:      Temp:      TempSrc:      SpO2: 100% 100% 100% 100%   Constitutional: appears age-appropriate, NAD, calm, comfortable Eyes: PERRL, lids and conjunctivae normal ENMT: Mucous membranes are moist. Posterior pharynx clear of any exudate or lesions. Age-appropriate dentition. Hearing appropriate Neck: normal, supple, no masses, no thyromegaly Respiratory: clear to auscultation  bilaterally, no wheezing, no crackles. Normal respiratory effort. No accessory muscle use.  Cardiovascular: Regular rate and rhythm, no murmurs / rubs / gallops. No extremity edema. 2+ pedal pulses. No carotid bruits.  Abdomen: Morbidly obese abdomen, soft, no tenderness, no masses palpated, no hepatosplenomegaly. Bowel sounds positive.  Musculoskeletal: no clubbing / cyanosis. No joint deformity upper and lower extremities. Good ROM, no contractures, no atrophy. Normal muscle tone.  Skin: no rashes, lesions, ulcers. No induration Neurologic: Sensation intact. Strength 5/5 in all 4.  Psychiatric: Normal judgment and insight. Alert and oriented x 3. Normal mood.   EKG: independently reviewed, showing normal sinus rhythm, rate of 96, QTc 421  Chest x-ray on Admission: I personally reviewed and I agree with radiologist reading as below.  DG Chest 2 View  Result Date: 06/02/2020 CLINICAL DATA:  Shortness of breath.  COVID. EXAM: CHEST - 2 VIEW COMPARISON:  09/03/2006. FINDINGS: Mediastinum hilar structures normal. Heart size normal. Low lung volumes with basilar atelectasis. Bilateral interstitial prominence. Findings consistent with pneumonitis. No pleural effusion or pneumothorax. IMPRESSION: Low lung volumes with bibasilar atelectasis. Bilateral interstitial prominence consistent with pneumonitis. Electronically Signed   By: Maisie Fus  Register   On: 06/02/2020 11:15   Labs on Admission: I have personally reviewed following labs  CBC: Recent Labs  Lab 06/02/20 1032  WBC 7.4  HGB 13.9  HCT 42.0  MCV 79.4*  PLT 256   Basic Metabolic Panel:  Recent Labs  Lab 06/02/20 1032  NA 138  K 3.0*  CL 102  CO2 26  GLUCOSE 118*  BUN 18  CREATININE 0.61  CALCIUM 8.8*   Jennae Hakeem N Sherian Valenza D.O. [facility]  If 7PM-7AM, please contact overnight-coverage provider If 7AM-7PM, please contact day coverage provider [email or web address]  06/02/2020, 4:03 PM

## 2020-06-02 NOTE — ED Triage Notes (Signed)
Pt was sent from [facility], states she was there for an antibody infusion but her RA sats were 89%, states they gave her NS infusion and zofran for nausea. Placed the pt on [facility] and sats were 92%, #24g in the Rhand

## 2020-06-02 NOTE — Progress Notes (Signed)
PHARMACIST - PHYSICIAN COMMUNICATION  CONCERNING:  Enoxaparin (Lovenox) for DVT Prophylaxis    RECOMMENDATION: Patient was prescribed enoxaprin 40mg  q24 hours for VTE prophylaxis.   Filed Weights   06/02/20 1750  Weight: 121.5 kg (267 lb 13.7 oz)    Body mass index is 44.57 kg/m.  Estimated Creatinine Clearance: 105.1 mL/min (by C-G formula based on SCr of 0.61 mg/dL).   Based on [facility] policy patient is candidate for enoxaparin 0.5mg /kg TBW SQ every 24 hours based on BMI being >30.  DESCRIPTION: Pharmacy has adjusted enoxaparin dose per [facility] policy.  Patient is now receiving enoxaparin 60 mg every 24 hours    [facility], PharmD Clinical Pharmacist  06/02/2020 6:11 PM

## 2020-06-02 NOTE — ED Notes (Signed)
Transport requested by unit ambassador.  

## 2020-06-02 NOTE — Progress Notes (Signed)
Remdesivir - Pharmacy Brief Note   O:  CXR: Low lung volumes with bibasilar atelectasis. Bilateral interstitial prominence consistent with pneumonitis. SpO2: 99 % on RA   A/P:  Remdesivir 200 mg IVPB once followed by 100 mg IVPB daily x 4 days.   Laureen Ochs, PharmD 06/02/2020 4:33 PM

## 2020-06-02 NOTE — ED Provider Notes (Signed)
[facility] Emergency Department Provider Note   ____________________________________________   Event Date/Time   First MD Initiated Contact with Patient 06/02/20 1516     (approximate)  I have reviewed the triage vital signs and the nursing notes.   HISTORY  Chief Complaint Shortness of Breath and COVID+    HPI Carol [name] is a 55 y.o. female reports that she was diagnosed with Covid 1 week ago via urgent care   She saw her primary doctor and was started on Levaquin, prednisone, and cough medicine Friday.  As she is continue to worsen starting to have increasing shortness of breath.  Yesterday she felt so weak and lightheaded at 1 point she actually briefly passed out without falling or injuring herself  She reports that she had a positive Covid test performed at urgent care, as well as started on treatment for possible pneumonia by her primary.  Reports shortness of breath relieved by oxygen use.  Feels like she is occasionally having some slight wheezing  No chest pain.  Slight nausea decreased appetite not eating well.  Feels dehydrated  She went to see her doctor today and reports that they gave her a bag of fluids.  She does not think she has been started on any anti-Covid treatment such as antibodies yet  Past Medical History:  Diagnosis Date  . Diabetes mellitus without complication [facility])     Patient Active Problem List   Diagnosis Date Noted  . Shortness of breath 06/02/2020  . Diverticulitis of intestine without bleeding 08/08/2016  . MRSA (methicillin resistant Staphylococcus aureus) infection 07/26/2016  . Recurrent boils 07/26/2016  . BMI 40.0-44.9, adult (HCC) 08/07/2014    Past Surgical History:  Procedure Laterality Date  . COLONOSCOPY WITH PROPOFOL N/A 08/26/2016   Procedure: COLONOSCOPY WITH PROPOFOL;  Surgeon: Wyline Mood, MD;  Location: [facility] ENDOSCOPY;  Service: Endoscopy;  Laterality: N/A;  . OOPHORECTOMY     1  removed    Prior to Admission medications   Medication Sig Start Date End Date Taking? Authorizing Provider  acetaminophen (TYLENOL) 500 MG tablet Take 500 mg by mouth every 6 (six) hours as needed for mild pain.   Yes [provider]  albuterol (VENTOLIN HFA) 108 (90 Base) MCG/ACT inhaler Inhale 1 puff into the lungs every 6 (six) hours as needed. 05/29/20  Yes [provider]  bisoprolol-hydrochlorothiazide (ZIAC) 2.5-6.25 MG tablet TAKE 1 TABLET BY MOUTH EVERY MORNING FOR BLOOD PRESSURE 08/28/17  Yes Lyndon Code, MD  Dulaglutide (TRULICITY) 0.75 MG/0.5ML SOPN Inject 0.75 mg into the skin once a week.   Yes [provider]  metFORMIN (GLUCOPHAGE) 500 MG tablet Take by mouth. 03/25/16 03/25/17  [provider]    Allergies Patient has no known allergies.  Family History  Problem Relation Age of Onset  . Breast cancer Maternal Aunt 40  . Lung cancer Mother   . Lung cancer Father   . Pancreatic cancer Father   . Lymphoma Brother   . Cancer Maternal Grandmother     Social History Social History   Tobacco Use  . Smoking status: Former Smoker    Types: Cigarettes    Quit date: 08/09/1999    Years since quitting: 20.8  . Smokeless tobacco: Never Used  Substance Use Topics  . Alcohol use: No  . Drug use: No    Review of Systems Constitutional: Fevers chills and fatigue Eyes: No visual changes. ENT: Mild sore throat Cardiovascular: Denies chest pain. Respiratory: See  HPI  gastrointestinal: No abdominal pain.  Positive for nausea Musculoskeletal: Negative for back pain. Skin: Negative for rash. Neurological: Negative for headaches, areas of focal weakness or numbness.    ____________________________________________   PHYSICAL EXAM:  VITAL SIGNS: ED Triage Vitals  Enc Vitals Group     BP 06/02/20 1030 122/76     Pulse Rate 06/02/20 1030 89     Resp 06/02/20 1030 19     Temp 06/02/20 1030 98.8 F (37.1 C)     Temp Source  06/02/20 1206 Oral     SpO2 06/02/20 1030 97 %     Weight --      Height --      Head Circumference --      Peak Flow --      Pain Score 06/02/20 1031 0     Pain Loc --      Pain Edu? --      Excl. in GC? --     Constitutional: Alert and oriented. Well appearing and in no acute distress. Eyes: Conjunctivae are normal. Head: Atraumatic. Nose: No congestion/rhinnorhea. Mouth/Throat: Mucous membranes are moist. Neck: No stridor.  Cardiovascular: Normal rate, regular rhythm. Grossly normal heart sounds.  Good peripheral circulation. Respiratory: Slight appearance of dyspnea, not hypoxic on oxygen at this time.  Was somewhat hypoxic however on room air at her clinic visit today  Gastrointestinal: Soft and nontender. No distention. Musculoskeletal: No lower extremity tenderness nor edema. Neurologic:  Normal speech and language. No gross focal neurologic deficits are appreciated.  Skin:  Skin is warm, dry and intact. No rash noted. Psychiatric: Mood and affect are normal. Speech and behavior are normal.  ____________________________________________   LABS (all labs ordered are listed, but only abnormal results are displayed)  Labs Reviewed  CBC - Abnormal; Notable for the following components:      Result Value   RBC 5.29 (*)    MCV 79.4 (*)    All other components within normal limits  BASIC METABOLIC PANEL - Abnormal; Notable for the following components:   Potassium 3.0 (*)    Glucose, Bld 118 (*)    Calcium 8.8 (*)    All other components within normal limits  POC SARS CORONAVIRUS 2 AG -  ED - Abnormal; Notable for the following components:   SARS Coronavirus 2 Ag Positive (*)    All other components within normal limits  CULTURE, BLOOD (ROUTINE X 2)  CULTURE, BLOOD (ROUTINE X 2)  PROCALCITONIN  HIV ANTIBODY (ROUTINE TESTING W REFLEX)  FIBRIN DERIVATIVES D-DIMER ([facility] ONLY)  C-REACTIVE PROTEIN  COMPREHENSIVE METABOLIC PANEL  CBC WITH DIFFERENTIAL/PLATELET  FIBRIN  DERIVATIVES D-DIMER ([facility] ONLY)  C-REACTIVE PROTEIN   ____________________________________________  EKG  Reviewed inter by me at 1030 Heart rate 95 QRS 85 QTc 420 Normal sinus rhythm, no evidence of acute ischemia ____________________________________________  RADIOLOGY  DG Chest 2 View  Result Date: 06/02/2020 CLINICAL DATA:  Shortness of breath.  COVID. EXAM: CHEST - 2 VIEW COMPARISON:  09/03/2006. FINDINGS: Mediastinum hilar structures normal. Heart size normal. Low lung volumes with basilar atelectasis. Bilateral interstitial prominence. Findings consistent with pneumonitis. No pleural effusion or pneumothorax. IMPRESSION: Low lung volumes with bibasilar atelectasis. Bilateral interstitial prominence consistent with pneumonitis. Electronically Signed   By: Maisie Fus  Register   On: 06/02/2020 11:15    Chest x-ray reviewed, and bibasilar interstitial prominence ____________________________________________   PROCEDURES  Procedure(s) performed: None  Procedures  Critical Care performed: Yes, see critical care note(s)  CRITICAL CARE Performed  by: Sharyn Creamer   Total critical care time: 25 minutes  Critical care time was exclusive of separately billable procedures and treating other patients.  Critical care was necessary to treat or prevent imminent or life-threatening deterioration.  Critical care was time spent personally by me on the following activities: development of treatment plan with patient and/or surrogate as well as nursing, discussions with consultants, evaluation of patient's response to treatment, examination of patient, obtaining history from patient or surrogate, ordering and performing treatments and interventions, ordering and review of laboratory studies, ordering and review of radiographic studies, pulse oximetry and re-evaluation of patient's condition  Patient reports positive Covid test within the last 7 days, also having mild hypoxia with evidence of  Covid with hypoxic respiratory failure requiring admission to the hospital..  ____________________________________________   INITIAL IMPRESSION / ASSESSMENT AND PLAN / ED COURSE  Pertinent labs & imaging results that were available during my care of the patient were reviewed by me and considered in my medical decision making (see chart for details).   Patient reports Covid, associated with mild hypoxia, she does report associated dyspnea.  She has mild hypoxia at the clinic today, corrects with oxygen.  Chest x-ray reassuring but given association of Covid suspect this is related with Covid pneumonia, also being treated with Levaquin by her primary care doctor which we will continue.  Also treat with steroids, and given she reports some feeling of wheezing will also trial albuterol.  Patient understanding this      ____________________________________________   FINAL CLINICAL IMPRESSION(S) / ED DIAGNOSES  Final diagnoses:  Acute hypoxemic respiratory failure due to COVID-19 [facility])        Note:  This document was prepared using Dragon voice recognition software and may include unintentional dictation errors       Sharyn Creamer, MD 06/02/20 1738

## 2020-06-02 NOTE — ED Triage Notes (Signed)
Pt comes from [facility] with c/o COVID+ and increased SOb. Pt states she was dx last Friday. Pt states she was also dx with double pneumonia. Pt states she went to [facility] for infusion and her RA sats were 89%. [facility] gave pt 500 of fluids and zofran. Pt placed on 2L with O2 at 92%.  Current O2 at 97% Kyle

## 2020-06-03 ENCOUNTER — Encounter: Payer: Self-pay | Admitting: Internal Medicine

## 2020-06-03 DIAGNOSIS — E876 Hypokalemia: Secondary | ICD-10-CM | POA: Diagnosis present

## 2020-06-03 DIAGNOSIS — Z79899 Other long term (current) drug therapy: Secondary | ICD-10-CM | POA: Diagnosis not present

## 2020-06-03 DIAGNOSIS — I10 Essential (primary) hypertension: Secondary | ICD-10-CM | POA: Diagnosis present

## 2020-06-03 DIAGNOSIS — Z7984 Long term (current) use of oral hypoglycemic drugs: Secondary | ICD-10-CM | POA: Diagnosis not present

## 2020-06-03 DIAGNOSIS — Z794 Long term (current) use of insulin: Secondary | ICD-10-CM | POA: Diagnosis not present

## 2020-06-03 DIAGNOSIS — Z87891 Personal history of nicotine dependence: Secondary | ICD-10-CM | POA: Diagnosis not present

## 2020-06-03 DIAGNOSIS — A0839 Other viral enteritis: Secondary | ICD-10-CM | POA: Diagnosis present

## 2020-06-03 DIAGNOSIS — J9601 Acute respiratory failure with hypoxia: Secondary | ICD-10-CM | POA: Diagnosis present

## 2020-06-03 DIAGNOSIS — J1282 Pneumonia due to coronavirus disease 2019: Secondary | ICD-10-CM | POA: Diagnosis present

## 2020-06-03 DIAGNOSIS — U071 COVID-19: Secondary | ICD-10-CM | POA: Diagnosis present

## 2020-06-03 DIAGNOSIS — E1165 Type 2 diabetes mellitus with hyperglycemia: Secondary | ICD-10-CM | POA: Diagnosis present

## 2020-06-03 DIAGNOSIS — R0602 Shortness of breath: Secondary | ICD-10-CM | POA: Diagnosis present

## 2020-06-03 DIAGNOSIS — Z6841 Body Mass Index (BMI) 40.0 and over, adult: Secondary | ICD-10-CM | POA: Diagnosis not present

## 2020-06-03 LAB — CBC WITH DIFFERENTIAL/PLATELET
Abs Immature Granulocytes: 0 10*3/uL (ref 0.00–0.07)
Basophils Absolute: 0 10*3/uL (ref 0.0–0.1)
Basophils Relative: 0 %
Eosinophils Absolute: 0 10*3/uL (ref 0.0–0.5)
Eosinophils Relative: 0 %
HCT: 40.9 % (ref 36.0–46.0)
Hemoglobin: 14 g/dL (ref 12.0–15.0)
Immature Granulocytes: 0 %
Lymphocytes Relative: 33 %
Lymphs Abs: 1 10*3/uL (ref 0.7–4.0)
MCH: 27 pg (ref 26.0–34.0)
MCHC: 34.2 g/dL (ref 30.0–36.0)
MCV: 78.8 fL — ABNORMAL LOW (ref 80.0–100.0)
Monocytes Absolute: 0.3 10*3/uL (ref 0.1–1.0)
Monocytes Relative: 10 %
Neutro Abs: 1.7 10*3/uL (ref 1.7–7.7)
Neutrophils Relative %: 57 %
Platelets: 251 10*3/uL (ref 150–400)
RBC: 5.19 MIL/uL — ABNORMAL HIGH (ref 3.87–5.11)
RDW: 12.8 % (ref 11.5–15.5)
WBC: 2.9 10*3/uL — ABNORMAL LOW (ref 4.0–10.5)
nRBC: 0 % (ref 0.0–0.2)

## 2020-06-03 LAB — COMPREHENSIVE METABOLIC PANEL
ALT: 18 U/L (ref 0–44)
AST: 20 U/L (ref 15–41)
Albumin: 3.4 g/dL — ABNORMAL LOW (ref 3.5–5.0)
Alkaline Phosphatase: 47 U/L (ref 38–126)
Anion gap: 11 (ref 5–15)
BUN: 14 mg/dL (ref 6–20)
CO2: 27 mmol/L (ref 22–32)
Calcium: 8.7 mg/dL — ABNORMAL LOW (ref 8.9–10.3)
Chloride: 103 mmol/L (ref 98–111)
Creatinine, Ser: 0.65 mg/dL (ref 0.44–1.00)
GFR, Estimated: >60 mL/min (ref >60)
Glucose, Bld: 174 mg/dL — ABNORMAL HIGH (ref 70–99)
Potassium: 3.8 mmol/L (ref 3.5–5.1)
Sodium: 141 mmol/L (ref 135–145)
Total Bilirubin: 0.4 mg/dL (ref 0.3–1.2)
Total Protein: 6.6 g/dL (ref 6.5–8.1)

## 2020-06-03 LAB — C-REACTIVE PROTEIN
CRP: 0.8 mg/dL (ref <1.0)
CRP: 1.6 mg/dL — ABNORMAL HIGH (ref <1.0)

## 2020-06-03 LAB — GLUCOSE, CAPILLARY
Glucose-Capillary: 108 mg/dL — ABNORMAL HIGH (ref 70–99)
Glucose-Capillary: 173 mg/dL — ABNORMAL HIGH (ref 70–99)
Glucose-Capillary: 252 mg/dL — ABNORMAL HIGH (ref 70–99)
Glucose-Capillary: 230 mg/dL — ABNORMAL HIGH (ref 70–99)

## 2020-06-03 LAB — FIBRIN DERIVATIVES D-DIMER (ARMC ONLY): Fibrin derivatives D-dimer (ARMC): 867.18 ng/mL (FEU) — ABNORMAL HIGH (ref 0.00–499.00)

## 2020-06-03 LAB — HIV ANTIBODY (ROUTINE TESTING W REFLEX): HIV Screen 4th Generation wRfx: NONREACTIVE

## 2020-06-03 MED ORDER — ORAL CARE MOUTH RINSE
15.0000 mL | Freq: Two times a day (BID) | OROMUCOSAL | Status: DC
  Administered 2020-06-04: 15 mL via OROMUCOSAL

## 2020-06-03 NOTE — Progress Notes (Signed)
Patient ID: Carol [name], female   DOB: 1966/01/20, 55 y.o.   MRN: [id number]  PROGRESS NOTE    Carol [name]  IRS:[id number] DOB: 1965/06/21 DOA: 06/02/2020 PCP: Barbette Reichmann, MD   Brief Narrative:  55 year old female with history of obesity, hypertension, non-insulin-dependent diabetes mellitus presented with worsening shortness of breath and hypoxia along with fever and diarrhea. Patient is unvaccinated for COVID-19. She tested positive for COVID-19 on 05/29/2020. She was admitted on 06/02/2020 for hypoxia secondary to COVID-19 infection and started on steroids and remdesivir.  Assessment & Plan:   COVID-19 pneumonia Acute hypoxic respiratory failure -Patient is unvaccinated. Tested positive for COVID-19 on 05/29/2020. Chest x-ray showed bilateral interstitial prominence.  -Currently still requiring 2 L oxygen via nasal cannula. Wean off as able. Continue remdesivir and Decadron. Use inhalers and incentive spirometry. -Continue airborne and contact precautions. -Monitor inflammatory markers daily. Procalcitonin negative on presentation  COVID-19 gastroenteritis -Treatment plan as above. Diarrhea improving.  Hypertension -Blood pressure stable. Continue bisoprolol hydrochlorothiazide  Diabetes mellitus type 2 with hyperglycemia -Continue Metformin along with CBGs with SSI  Hypokalemia -Improved  Morbid obesity -Outpatient follow-up   DVT prophylaxis: Lovenox Code Status: Full Family Communication: None Disposition Plan: Status is: Observation  The patient will require care spanning > 2 midnights and should be moved to inpatient because: Inpatient level of care appropriate due to severity of illness  Dispo: The patient is from: Home              Anticipated d/c is to: Home              Anticipated d/c date is: 2 days              Patient currently is not medically stable to d/c.   Difficult to place patient No   Consultants: None  Procedures:  None  Antimicrobials: None   Subjective: Patient seen and examined at bedside. Feels the same as yesterday. Does not feel worse. Complains of dry cough with some shortness of breath. Denies current nausea or vomiting.  Objective: Vitals:   06/02/20 2040 06/03/20 0100 06/03/20 0429 06/03/20 0759  BP: 121/77 107/75 111/61 114/72  Pulse: 98 93 74 75  Resp: 20 20 17 18   Temp: 98.9 F (37.2 C) 98.8 F (37.1 C) 98.6 F (37 C) 98.2 F (36.8 C)  TempSrc: Oral Oral  Oral  SpO2: 98% 97% 100% 98%  Weight:      Height:        Intake/Output Summary (Last 24 hours) at 06/03/2020 1114 Last data filed at 06/03/2020 0602 Gross per 24 hour  Intake 0 ml  Output 2 ml  Net -2 ml   Filed Weights   06/02/20 1750  Weight: 121.5 kg    Examination:  General exam: Appears calm and comfortable. Currently on 2 L oxygen via nasal cannula Respiratory system: Bilateral decreased breath sounds at bases with scattered crackles Cardiovascular system: S1 & S2 heard, Rate controlled Gastrointestinal system: Abdomen is morbidly obese, nondistended, soft and nontender. Normal bowel sounds heard. Extremities: No cyanosis, clubbing; trace lower extremity edema Central nervous system: Alert and oriented. No focal neurological deficits. Moving extremities Skin: No rashes, lesions or ulcers Psychiatry: Judgement and insight appear normal. Mood & affect appropriate.     Data Reviewed: I have personally reviewed following labs and imaging studies  CBC: Recent Labs  Lab 06/02/20 1032 06/03/20 0505  WBC 7.4 2.9*  NEUTROABS  --  1.7  HGB 13.9 14.0  HCT 42.0  40.9  MCV 79.4* 78.8*  PLT 256 251   Basic Metabolic Panel: Recent Labs  Lab 06/02/20 1032 06/03/20 0505  NA 138 141  K 3.0* 3.8  CL 102 103  CO2 26 27  GLUCOSE 118* 174*  BUN 18 14  CREATININE 0.61 0.65  CALCIUM 8.8* 8.7*   GFR: Estimated Creatinine Clearance: 105.1 mL/min (by C-G formula based on SCr of 0.65 mg/dL). Liver Function  Tests: Recent Labs  Lab 06/03/20 0505  AST 20  ALT 18  ALKPHOS 47  BILITOT 0.4  PROT 6.6  ALBUMIN 3.4*   No results for input(s): LIPASE, AMYLASE in the last 168 hours. No results for input(s): AMMONIA in the last 168 hours. Coagulation Profile: No results for input(s): INR, PROTIME in the last 168 hours. Cardiac Enzymes: No results for input(s): CKTOTAL, CKMB, CKMBINDEX, TROPONINI in the last 168 hours. BNP (last 3 results) No results for input(s): PROBNP in the last 8760 hours. HbA1C: No results for input(s): HGBA1C in the last 72 hours. CBG: Recent Labs  Lab 06/02/20 2200 06/03/20 0812  GLUCAP 241* 108*   Lipid Profile: No results for input(s): CHOL, HDL, LDLCALC, TRIG, CHOLHDL, LDLDIRECT in the last 72 hours. Thyroid Function Tests: No results for input(s): TSH, T4TOTAL, FREET4, T3FREE, THYROIDAB in the last 72 hours. Anemia Panel: No results for input(s): VITAMINB12, FOLATE, FERRITIN, TIBC, IRON, RETICCTPCT in the last 72 hours. Sepsis Labs: Recent Labs  Lab 06/02/20 1032  PROCALCITON <0.10    Recent Results (from the past 240 hour(s))  Culture, blood (Routine X 2) w Reflex to ID Panel     Status: None (Preliminary result)   Collection Time: 06/02/20  5:35 PM   Specimen: BLOOD  Result Value Ref Range Status   Specimen Description BLOOD RIGHT ANTECUBITAL  Final   Special Requests   Final    BOTTLES DRAWN AEROBIC AND ANAEROBIC Blood Culture adequate volume   Culture   Final    NO GROWTH < 24 HOURS Performed at [facility], [address] [place], [place] [address]    Report Status PENDING  Incomplete  Culture, blood (Routine X 2) w Reflex to ID Panel     Status: None (Preliminary result)   Collection Time: 06/02/20  5:40 PM   Specimen: BLOOD  Result Value Ref Range Status   Specimen Description BLOOD LEFT ANTECUBITAL  Final   Special Requests   Final    BOTTLES DRAWN AEROBIC AND ANAEROBIC Blood Culture results may not be optimal due to an  inadequate volume of blood received in culture bottles   Culture   Final    NO GROWTH < 24 HOURS Performed at [facility], [address], [place], [place] [address]    Report Status PENDING  Incomplete         Radiology Studies: DG Chest 2 View  Result Date: 06/02/2020 CLINICAL DATA:  Shortness of breath.  COVID. EXAM: CHEST - 2 VIEW COMPARISON:  09/03/2006. FINDINGS: Mediastinum hilar structures normal. Heart size normal. Low lung volumes with basilar atelectasis. Bilateral interstitial prominence. Findings consistent with pneumonitis. No pleural effusion or pneumothorax. IMPRESSION: Low lung volumes with bibasilar atelectasis. Bilateral interstitial prominence consistent with pneumonitis. Electronically Signed   By: Maisie Fus  Register   On: 06/02/2020 11:15        Scheduled Meds: . albuterol  1 puff Inhalation Q4H while awake  . vitamin C  500 mg Oral Daily  . bisoprolol-hydrochlorothiazide  1 tablet Oral Daily  . dexamethasone (DECADRON)  injection  6 mg Intravenous Q24H  . enoxaparin (LOVENOX) injection  0.5 mg/kg Subcutaneous Q24H  . insulin aspart  0-5 Units Subcutaneous QHS  . insulin aspart  0-9 Units Subcutaneous TID WC  . mouth rinse  15 mL Mouth Rinse BID  . metFORMIN  1,500 mg Oral Q breakfast  . zinc sulfate  220 mg Oral Daily   Continuous Infusions: . remdesivir 100 mg in NS 100 mL 100 mg (06/03/20 2585)          Glade Lloyd, MD [facility] 06/03/2020, 11:14 AM

## 2020-06-04 LAB — COMPREHENSIVE METABOLIC PANEL
ALT: 15 U/L (ref 0–44)
AST: 18 U/L (ref 15–41)
Albumin: 3.1 g/dL — ABNORMAL LOW (ref 3.5–5.0)
Alkaline Phosphatase: 45 U/L (ref 38–126)
Anion gap: 9 (ref 5–15)
BUN: 15 mg/dL (ref 6–20)
CO2: 29 mmol/L (ref 22–32)
Calcium: 8.6 mg/dL — ABNORMAL LOW (ref 8.9–10.3)
Chloride: 104 mmol/L (ref 98–111)
Creatinine, Ser: 0.54 mg/dL (ref 0.44–1.00)
GFR, Estimated: >60 mL/min (ref >60)
Glucose, Bld: 134 mg/dL — ABNORMAL HIGH (ref 70–99)
Potassium: 3.5 mmol/L (ref 3.5–5.1)
Sodium: 142 mmol/L (ref 135–145)
Total Bilirubin: 0.6 mg/dL (ref 0.3–1.2)
Total Protein: 6.2 g/dL — ABNORMAL LOW (ref 6.5–8.1)

## 2020-06-04 LAB — CBC WITH DIFFERENTIAL/PLATELET
Abs Immature Granulocytes: 0.01 10*3/uL (ref 0.00–0.07)
Basophils Absolute: 0 10*3/uL (ref 0.0–0.1)
Basophils Relative: 0 %
Eosinophils Absolute: 0 10*3/uL (ref 0.0–0.5)
Eosinophils Relative: 0 %
HCT: 38.9 % (ref 36.0–46.0)
Hemoglobin: 13 g/dL (ref 12.0–15.0)
Immature Granulocytes: 0 %
Lymphocytes Relative: 30 %
Lymphs Abs: 1.7 10*3/uL (ref 0.7–4.0)
MCH: 26.4 pg (ref 26.0–34.0)
MCHC: 33.4 g/dL (ref 30.0–36.0)
MCV: 79.1 fL — ABNORMAL LOW (ref 80.0–100.0)
Monocytes Absolute: 0.6 10*3/uL (ref 0.1–1.0)
Monocytes Relative: 11 %
Neutro Abs: 3.3 10*3/uL (ref 1.7–7.7)
Neutrophils Relative %: 59 %
Platelets: 292 10*3/uL (ref 150–400)
RBC: 4.92 MIL/uL (ref 3.87–5.11)
RDW: 12.8 % (ref 11.5–15.5)
WBC: 5.5 10*3/uL (ref 4.0–10.5)
nRBC: 0 % (ref 0.0–0.2)

## 2020-06-04 LAB — MAGNESIUM: Magnesium: 2.1 mg/dL (ref 1.7–2.4)

## 2020-06-04 LAB — GLUCOSE, CAPILLARY
Glucose-Capillary: 98 mg/dL (ref 70–99)
Glucose-Capillary: 141 mg/dL — ABNORMAL HIGH (ref 70–99)

## 2020-06-04 LAB — C-REACTIVE PROTEIN: CRP: 0.8 mg/dL (ref <1.0)

## 2020-06-04 LAB — FIBRIN DERIVATIVES D-DIMER (ARMC ONLY): Fibrin derivatives D-dimer (ARMC): 765.92 ng/mL (FEU) — ABNORMAL HIGH (ref 0.00–499.00)

## 2020-06-04 MED ORDER — GUAIFENESIN-DM 100-10 MG/5ML PO SYRP: 10.0000 mL | ORAL_SOLUTION | ORAL | 0 refills | Status: AC | PRN

## 2020-06-04 MED ORDER — DEXAMETHASONE 6 MG PO TABS: 6.0000 mg | ORAL_TABLET | Freq: Every day | ORAL | 0 refills | Status: AC

## 2020-06-04 NOTE — Discharge Summary (Signed)
Physician Discharge Summary  Carol [name] ZOX:[id number] DOB: [birth year] DOA: 06/02/2020  PCP: Barbette Reichmann, MD  Admit date: 06/02/2020 Discharge date: 06/04/2020  Admitted From: Home Disposition: Home  Recommendations for Outpatient Follow-up:  1. Follow up with PCP in 1 week  2. Follow up in ED if symptoms worsen or new appear   Home Health: No Equipment/Devices: None  Discharge Condition: Stable CODE STATUS: Full Diet recommendation: Heart healthy/carb modified   Brief/Interim Summary: 55 year old female with history of obesity, hypertension, non-insulin-dependent diabetes mellitus presented with worsening shortness of breath and hypoxia along with fever and diarrhea. Patient is unvaccinated for COVID-19. She tested positive for COVID-19 on 05/29/2020. She was admitted on 06/02/2020 for hypoxia secondary to COVID-19 infection and started on steroids and remdesivir.  During hospitalization, her condition has improved.  Currently on room air.  Feels much better and wants to go home.  She will be discharged on oral Decadron for 7 more days.   Discharge Diagnoses:   COVID-19 pneumonia Acute hypoxic respiratory failure -Patient is unvaccinated. Tested positive for COVID-19 on 05/29/2020. Chest x-ray showed bilateral interstitial prominence.  -Currently on remdesivir, day #3 and Decadron.  -Respiratory status has much improved and patient is currently on room air and wants to go home. -She will be discharged on oral Decadron 6 mg daily for 7 more days. -Outpatient follow-up with PCP regarding timing of vaccine -Complete isolation at home  COVID-19 gastroenteritis -Treatment plan as above. Diarrhea improved.  Hypertension -Blood pressure stable. Continue bisoprolol hydrochlorothiazide  Diabetes mellitus type 2 with hyperglycemia -Continue home regimen.  Carb modified diet.  Hypokalemia -Improved  Morbid obesity -Outpatient follow-up  Discharge  Instructions  Discharge Instructions    Diet - low sodium heart healthy   Complete by: As directed    Diet Carb Modified   Complete by: As directed    Increase activity slowly   Complete by: As directed      Allergies as of 06/04/2020   No Known Allergies     Medication List    TAKE these medications   acetaminophen 500 MG tablet Commonly known as: TYLENOL Take 500 mg by mouth every 6 (six) hours as needed for mild pain.   albuterol 108 (90 Base) MCG/ACT inhaler Commonly known as: VENTOLIN HFA Inhale 1 puff into the lungs every 6 (six) hours as needed.   bisoprolol-hydrochlorothiazide 2.5-6.25 MG tablet Commonly known as: ZIAC TAKE 1 TABLET BY MOUTH EVERY MORNING FOR BLOOD PRESSURE   dexamethasone 6 MG tablet Commonly known as: DECADRON Take 1 tablet (6 mg total) by mouth daily. Start taking on: June 05, 2020   guaiFENesin-dextromethorphan 100-10 MG/5ML syrup Commonly known as: ROBITUSSIN DM Take 10 mLs by mouth every 4 (four) hours as needed for cough.   metFORMIN 500 MG tablet Commonly known as: GLUCOPHAGE Take by mouth.   Trulicity 0.75 MG/0.5ML Sopn Generic drug: Dulaglutide Inject 0.75 mg into the skin once a week.       Follow-up Information    Barbette Reichmann, MD. Schedule an appointment as soon as possible for a visit in 1 week(s).   Specialty: Internal Medicine Contact information: [address] [place] [address] [phone]              No Known Allergies  Consultations:  None   Procedures/Studies: DG Chest 2 View  Result Date: 06/02/2020 CLINICAL DATA:  Shortness of breath.  COVID. EXAM: CHEST - 2 VIEW COMPARISON:  09/03/2006. FINDINGS: Mediastinum hilar structures normal.  Heart size normal. Low lung volumes with basilar atelectasis. Bilateral interstitial prominence. Findings consistent with pneumonitis. No pleural effusion or pneumothorax. IMPRESSION: Low lung volumes with bibasilar  atelectasis. Bilateral interstitial prominence consistent with pneumonitis. Electronically Signed   By: Maisie Fus  Register   On: 06/02/2020 11:15   MM 3D SCREEN BREAST BILATERAL  Result Date: 05/07/2020 CLINICAL DATA:  Screening. EXAM: DIGITAL SCREENING BILATERAL MAMMOGRAM WITH TOMO AND CAD COMPARISON:  Previous exam(s). ACR Breast Density Category a: The breast tissue is almost entirely fatty. FINDINGS: There are no findings suspicious for malignancy. Images were processed with CAD. IMPRESSION: No mammographic evidence of malignancy. A result letter of this screening mammogram will be mailed directly to the patient. RECOMMENDATION: Screening mammogram in one year. (Code:SM-B-01Y) BI-RADS CATEGORY  1: Negative. Electronically Signed   By: Amie Portland M.D.   On: 05/07/2020 16:29       Subjective: Patient seen and examined at bedside.  She feels much better and thinks that she is ready to go home.  Denies worsening cough or shortness of breath.  No overnight fever or vomiting reported.  Discharge Exam: Vitals:   06/04/20 0428 06/04/20 0842  BP: 113/79 113/76  Pulse: 82 86  Resp: 18 18  Temp: (!) 97.5 F (36.4 C) 98 F (36.7 C)  SpO2: 95% 96%    General: Pt is alert, awake, not in acute distress.  Currently on room air. Cardiovascular: rate controlled, S1/S2 + Respiratory: bilateral decreased breath sounds at bases Abdominal: Soft, morbidly obese, NT, ND, bowel sounds + Extremities: Trace lower extremity edema; no cyanosis    The results of significant diagnostics from this hospitalization (including imaging, microbiology, ancillary and laboratory) are listed below for reference.     Microbiology: Recent Results (from the past 240 hour(s))  Culture, blood (Routine X 2) w Reflex to ID Panel     Status: None (Preliminary result)   Collection Time: 06/02/20  5:35 PM   Specimen: BLOOD  Result Value Ref Range Status   Specimen Description BLOOD RIGHT ANTECUBITAL  Final   Special  Requests   Final    BOTTLES DRAWN AEROBIC AND ANAEROBIC Blood Culture adequate volume   Culture   Final    NO GROWTH 2 DAYS Performed at [facility], [address] [place], [place] [address]    Report Status PENDING  Incomplete  Culture, blood (Routine X 2) w Reflex to ID Panel     Status: None (Preliminary result)   Collection Time: 06/02/20  5:40 PM   Specimen: BLOOD  Result Value Ref Range Status   Specimen Description BLOOD LEFT ANTECUBITAL  Final   Special Requests   Final    BOTTLES DRAWN AEROBIC AND ANAEROBIC Blood Culture results may not be optimal due to an inadequate volume of blood received in culture bottles   Culture   Final    NO GROWTH 2 DAYS Performed at [facility], [address] [place], [place] [address]    Report Status PENDING  Incomplete     Labs: BNP (last 3 results) No results for input(s): BNP in the last 8760 hours. Basic Metabolic Panel: Recent Labs  Lab 06/02/20 1032 06/03/20 0505 06/04/20 0518  NA 138 141 142  K 3.0* 3.8 3.5  CL 102 103 104  CO2 26 27 29   GLUCOSE 118* 174* 134*  BUN 18 14 15   CREATININE 0.61 0.65 0.54  CALCIUM 8.8* 8.7* 8.6*  MG  --   --  2.1   Liver  Function Tests: Recent Labs  Lab 06/03/20 0505 06/04/20 0518  AST 20 18  ALT 18 15  ALKPHOS 47 45  BILITOT 0.4 0.6  PROT 6.6 6.2*  ALBUMIN 3.4* 3.1*   No results for input(s): LIPASE, AMYLASE in the last 168 hours. No results for input(s): AMMONIA in the last 168 hours. CBC: Recent Labs  Lab 06/02/20 1032 06/03/20 0505 06/04/20 0518  WBC 7.4 2.9* 5.5  NEUTROABS  --  1.7 3.3  HGB 13.9 14.0 13.0  HCT 42.0 40.9 38.9  MCV 79.4* 78.8* 79.1*  PLT 256 251 292   Cardiac Enzymes: No results for input(s): CKTOTAL, CKMB, CKMBINDEX, TROPONINI in the last 168 hours. BNP: Invalid input(s): POCBNP CBG: Recent Labs  Lab 06/03/20 0812 06/03/20 1229 06/03/20 1652 06/03/20 2122 06/04/20 0840  GLUCAP 108* 173* 252* 230* 98   D-Dimer No  results for input(s): DDIMER in the last 72 hours. Hgb A1c No results for input(s): HGBA1C in the last 72 hours. Lipid Profile No results for input(s): CHOL, HDL, LDLCALC, TRIG, CHOLHDL, LDLDIRECT in the last 72 hours. Thyroid function studies No results for input(s): TSH, T4TOTAL, T3FREE, THYROIDAB in the last 72 hours.  Invalid input(s): FREET3 Anemia work up No results for input(s): VITAMINB12, FOLATE, FERRITIN, TIBC, IRON, RETICCTPCT in the last 72 hours. Urinalysis No results found for: COLORURINE, APPEARANCEUR, LABSPEC, PHURINE, GLUCOSEU, HGBUR, BILIRUBINUR, KETONESUR, PROTEINUR, UROBILINOGEN, NITRITE, LEUKOCYTESUR Sepsis Labs Invalid input(s): PROCALCITONIN,  WBC,  LACTICIDVEN Microbiology Recent Results (from the past 240 hour(s))  Culture, blood (Routine X 2) w Reflex to ID Panel     Status: None (Preliminary result)   Collection Time: 06/02/20  5:35 PM   Specimen: BLOOD  Result Value Ref Range Status   Specimen Description BLOOD RIGHT ANTECUBITAL  Final   Special Requests   Final    BOTTLES DRAWN AEROBIC AND ANAEROBIC Blood Culture adequate volume   Culture   Final    NO GROWTH 2 DAYS Performed at [facility], [address] [place], [place] [address]    Report Status PENDING  Incomplete  Culture, blood (Routine X 2) w Reflex to ID Panel     Status: None (Preliminary result)   Collection Time: 06/02/20  5:40 PM   Specimen: BLOOD  Result Value Ref Range Status   Specimen Description BLOOD LEFT ANTECUBITAL  Final   Special Requests   Final    BOTTLES DRAWN AEROBIC AND ANAEROBIC Blood Culture results may not be optimal due to an inadequate volume of blood received in culture bottles   Culture   Final    NO GROWTH 2 DAYS Performed at [facility], [address], [place], [place] [address]    Report Status PENDING  Incomplete     Time coordinating discharge: 35 minutes  SIGNED:   Glade Lloyd, MD  [facility] 06/04/2020, 10:01  AM

## 2020-06-04 NOTE — Progress Notes (Signed)
Patients daughter not available to transport until 3:30pm. Facilitating [id number] for transport home. Discharge completed by primary RN. Bo Mcclintock, RN

## 2020-06-07 LAB — CULTURE, BLOOD (ROUTINE X 2)
Culture: NO GROWTH
Culture: NO GROWTH
Special Requests: ADEQUATE

## 2021-06-23 ENCOUNTER — Other Ambulatory Visit: Payer: Self-pay | Admitting: Internal Medicine

## 2021-06-23 DIAGNOSIS — Z1231 Encounter for screening mammogram for malignant neoplasm of breast: Secondary | ICD-10-CM

## 2021-08-02 ENCOUNTER — Ambulatory Visit
Admission: RE | Admit: 2021-08-02 | Discharge: 2021-08-02 | Disposition: A | Discharge status: Home / Self Care | Payer: BC Managed Care – PPO | Source: Ambulatory Visit | Attending: Internal Medicine | Admitting: Internal Medicine

## 2021-08-02 ENCOUNTER — Other Ambulatory Visit: Payer: Self-pay

## 2021-08-02 DIAGNOSIS — Z1231 Encounter for screening mammogram for malignant neoplasm of breast: Secondary | ICD-10-CM | POA: Diagnosis not present

## 2021-08-18 ENCOUNTER — Other Ambulatory Visit: Payer: Self-pay | Admitting: Internal Medicine

## 2021-08-18 DIAGNOSIS — R142 Eructation: Secondary | ICD-10-CM

## 2021-08-18 DIAGNOSIS — R195 Other fecal abnormalities: Secondary | ICD-10-CM

## 2021-08-18 DIAGNOSIS — E1165 Type 2 diabetes mellitus with hyperglycemia: Secondary | ICD-10-CM

## 2021-08-20 ENCOUNTER — Ambulatory Visit
Admission: RE | Admit: 2021-08-20 | Discharge: 2021-08-20 | Disposition: A | Discharge status: Home / Self Care | Payer: BC Managed Care – PPO | Source: Ambulatory Visit | Attending: Internal Medicine | Admitting: Internal Medicine

## 2021-08-20 DIAGNOSIS — R142 Eructation: Secondary | ICD-10-CM | POA: Diagnosis present

## 2021-08-20 DIAGNOSIS — E1165 Type 2 diabetes mellitus with hyperglycemia: Secondary | ICD-10-CM

## 2021-08-20 DIAGNOSIS — R195 Other fecal abnormalities: Secondary | ICD-10-CM | POA: Diagnosis not present

## 2023-03-09 ENCOUNTER — Other Ambulatory Visit: Payer: Self-pay | Admitting: Internal Medicine

## 2023-03-09 DIAGNOSIS — Z1231 Encounter for screening mammogram for malignant neoplasm of breast: Secondary | ICD-10-CM

## 2023-03-29 ENCOUNTER — Ambulatory Visit
Admission: RE | Admit: 2023-03-29 | Discharge: 2023-03-29 | Disposition: A | Discharge status: Home / Self Care | Payer: BC Managed Care – PPO | Source: Ambulatory Visit | Attending: Internal Medicine | Admitting: Internal Medicine

## 2023-03-29 DIAGNOSIS — Z1231 Encounter for screening mammogram for malignant neoplasm of breast: Secondary | ICD-10-CM | POA: Diagnosis present

## 2024-02-13 ENCOUNTER — Other Ambulatory Visit: Payer: Self-pay | Admitting: Family Medicine

## 2024-02-13 ENCOUNTER — Ambulatory Visit
Admission: RE | Admit: 2024-02-13 | Discharge: 2024-02-13 | Disposition: A | Discharge status: Home / Self Care | Source: Ambulatory Visit | Attending: Family Medicine | Admitting: Family Medicine

## 2024-02-13 DIAGNOSIS — M7989 Other specified soft tissue disorders: Secondary | ICD-10-CM | POA: Insufficient documentation

## 2024-02-13 DIAGNOSIS — M79604 Pain in right leg: Secondary | ICD-10-CM | POA: Diagnosis present

## 2024-03-06 IMAGING — MG MM DIGITAL SCREENING BILAT W/ TOMO AND CAD
8 series · 8 of 24 positions shown · non-contrast
Comparison: Previous exam(s).

CLINICAL DATA: Screening.

EXAM:
DIGITAL SCREENING BILATERAL MAMMOGRAM WITH TOMOSYNTHESIS AND CAD
TECHNIQUE: Bilateral screening digital craniocaudal and mediolateral oblique
mammograms were obtained. Bilateral screening digital breast
tomosynthesis was performed. The images were evaluated with
computer-aided detection.

[L MLO synth-2D]
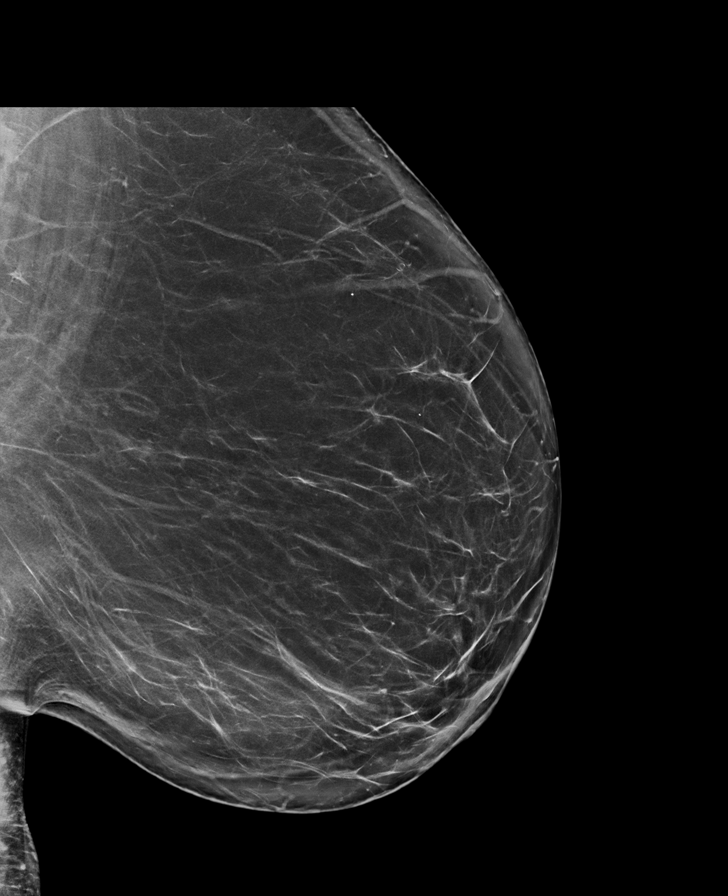

[R CC synth-2D]
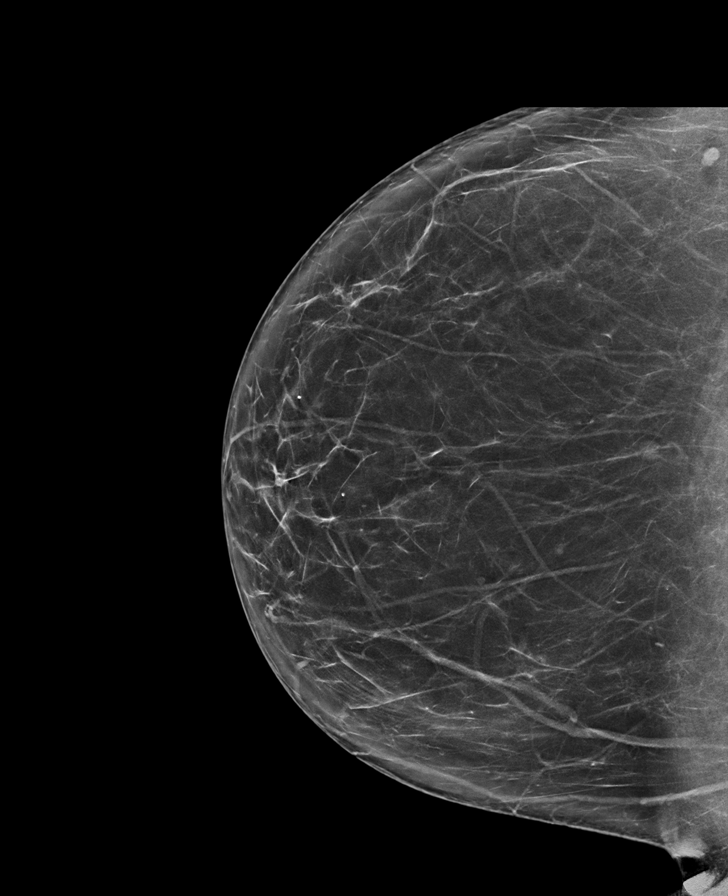

[R MLO synth-2D]
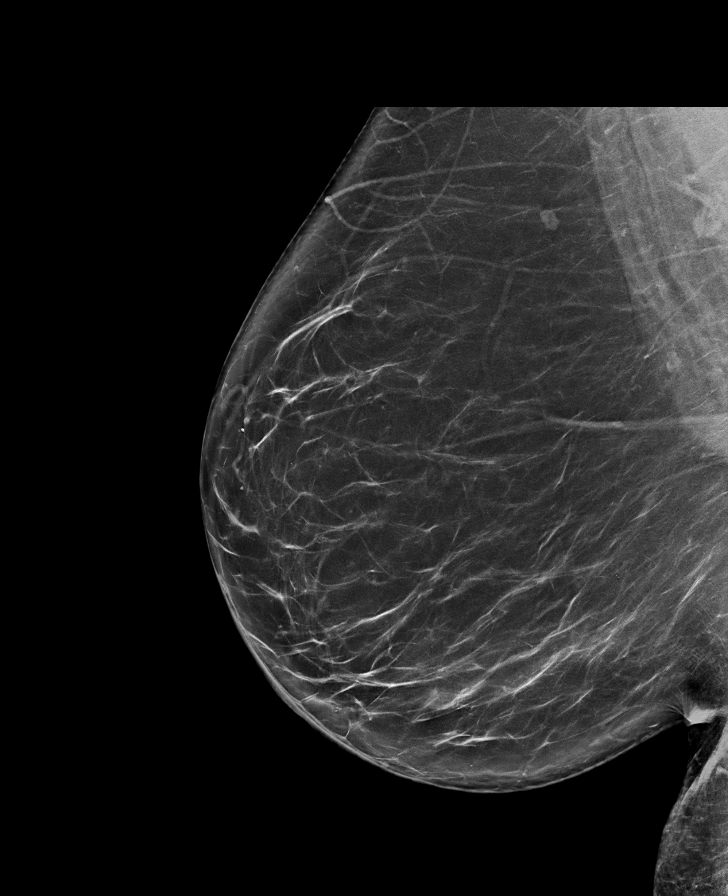

[L CC synth-2D]
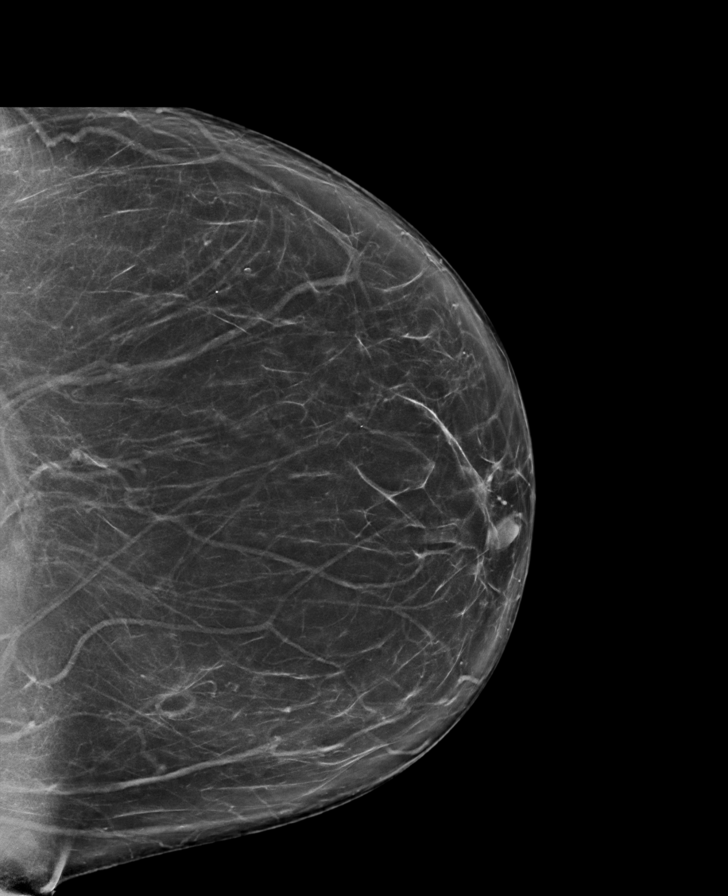

[L MLO tomo · tomo slice 46/91.0]
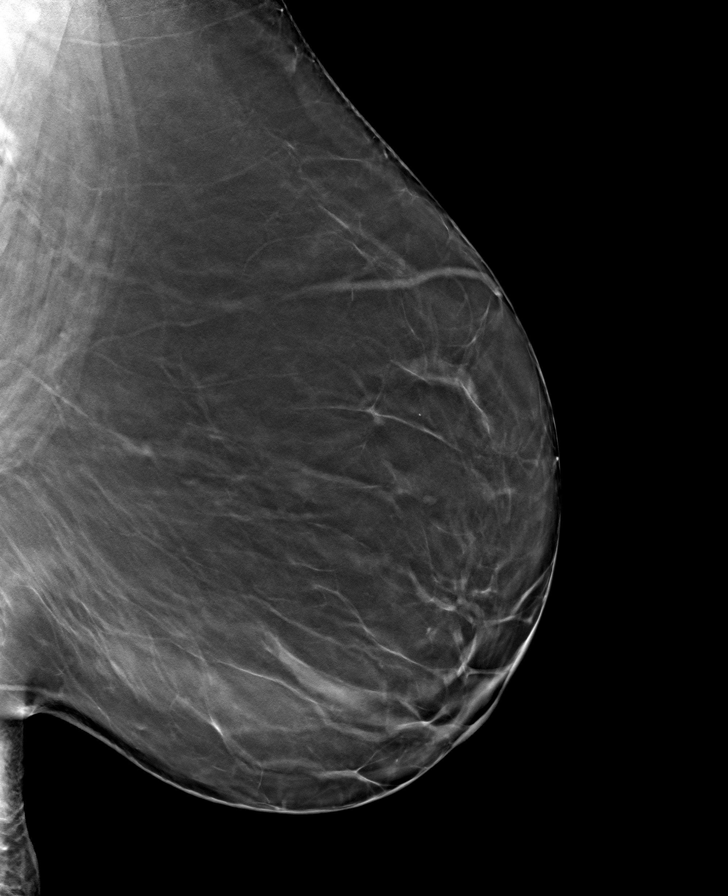

[R CC tomo · tomo slice 43/84.0]
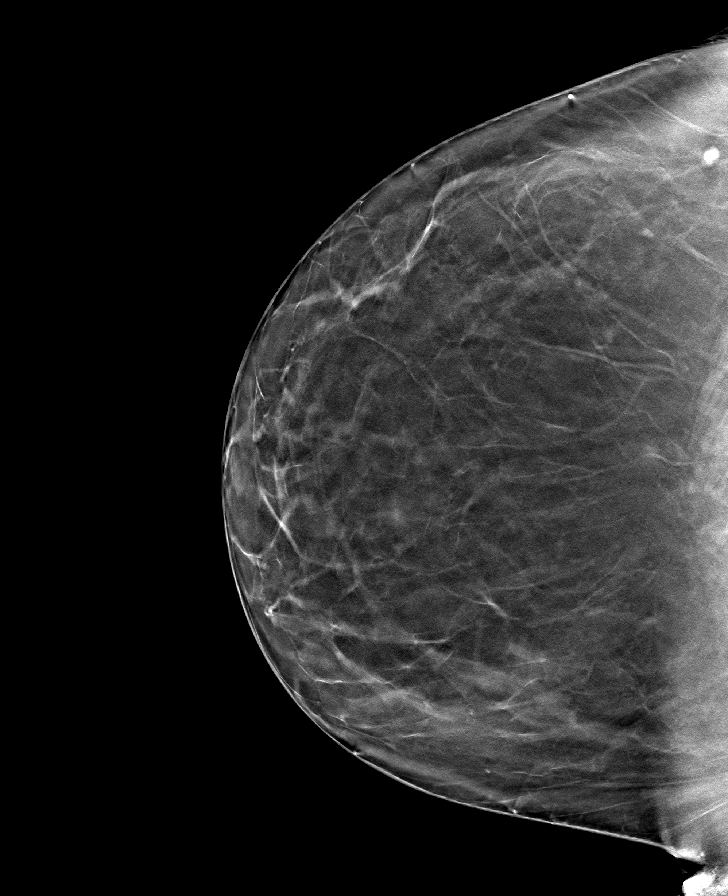

[L CC tomo · tomo slice 45/88.0]
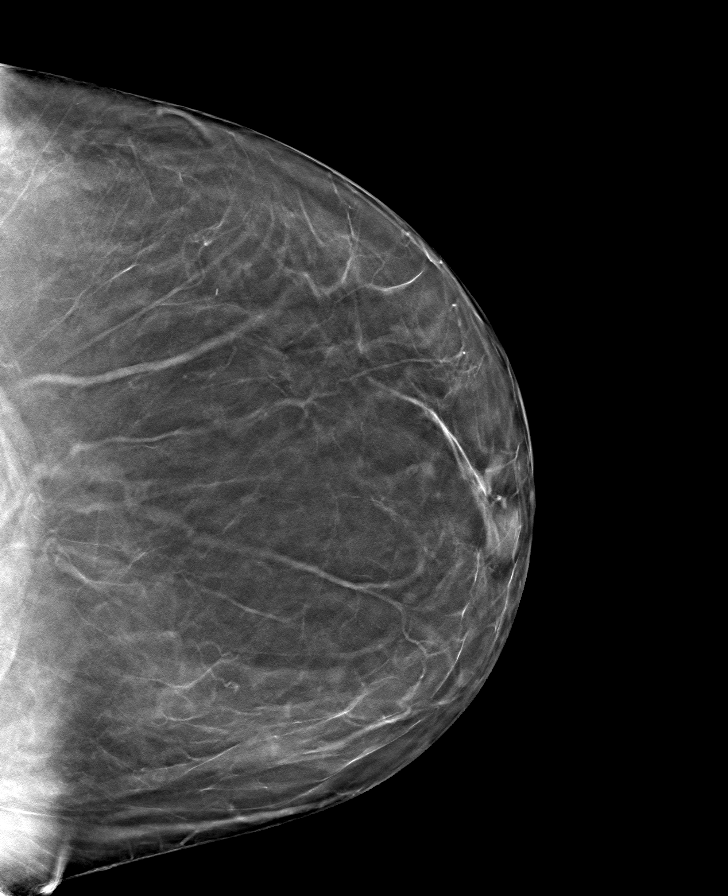

[R MLO tomo · tomo slice 47/92.0]
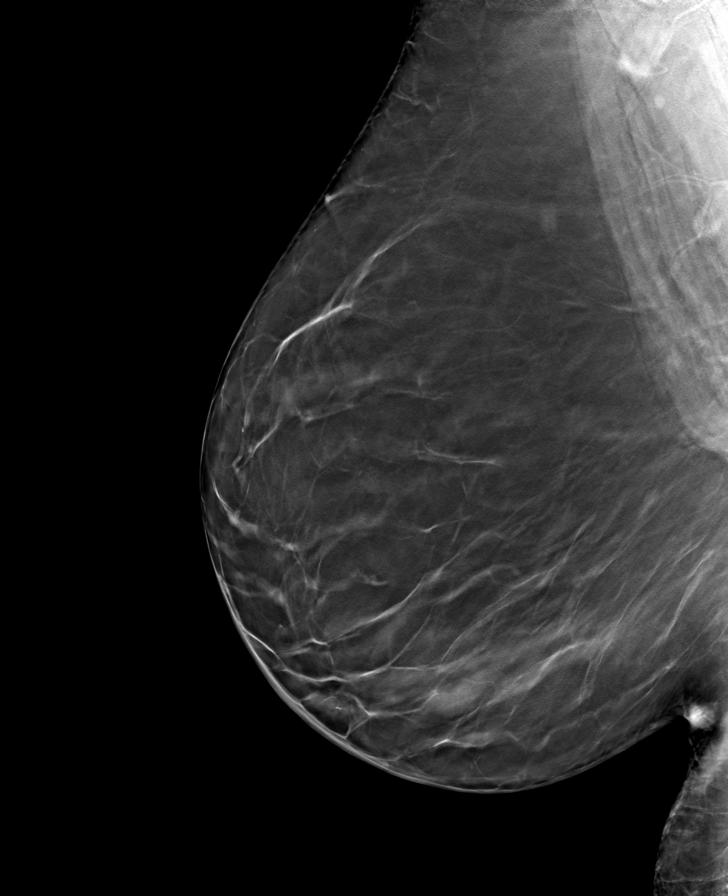

[8 of 24 positions shown; findings below may reference images not displayed]

ACR Breast Density Category b: There are scattered areas of
fibroglandular density.
FINDINGS: There are no findings suspicious for malignancy.
IMPRESSION: No mammographic evidence of malignancy. A result letter of this
screening mammogram will be mailed directly to the patient.

RECOMMENDATION:
Screening mammogram in one year. (Code:51-O-LD2)

BI-RADS CATEGORY  1: Negative.
